# Patient Record
Sex: Male | Born: 1950 | Race: Black or African American | Hispanic: No | Marital: Married | Smoking: Former smoker
Health system: Southern US, Community
[De-identification: ages and names within clinical notes are randomized; demographics above are authoritative.]

## PROBLEM LIST (undated history)

## (undated) DIAGNOSIS — K219 Gastro-esophageal reflux disease without esophagitis: Secondary | ICD-10-CM

## (undated) DIAGNOSIS — E785 Hyperlipidemia, unspecified: Secondary | ICD-10-CM

## (undated) DIAGNOSIS — E119 Type 2 diabetes mellitus without complications: Secondary | ICD-10-CM

## (undated) DIAGNOSIS — K635 Polyp of colon: Secondary | ICD-10-CM

## (undated) DIAGNOSIS — F1411 Cocaine abuse, in remission: Secondary | ICD-10-CM

## (undated) DIAGNOSIS — M109 Gout, unspecified: Secondary | ICD-10-CM

## (undated) DIAGNOSIS — F1010 Alcohol abuse, uncomplicated: Secondary | ICD-10-CM

## (undated) HISTORY — DX: Type 2 diabetes mellitus without complications: E11.9

## (undated) HISTORY — DX: Hyperlipidemia, unspecified: E78.5

## (undated) HISTORY — DX: Gastro-esophageal reflux disease without esophagitis: K21.9

## (undated) HISTORY — DX: Alcohol abuse, uncomplicated: F10.10

## (undated) HISTORY — DX: Polyp of colon: K63.5

## (undated) HISTORY — DX: Cocaine abuse, in remission: F14.11

## (undated) HISTORY — DX: Gout, unspecified: M10.9

---

## 2005-11-24 DIAGNOSIS — K635 Polyp of colon: Secondary | ICD-10-CM

## 2005-11-24 HISTORY — DX: Polyp of colon: K63.5

## 2015-05-03 ENCOUNTER — Encounter: Payer: Self-pay | Admitting: Urgent Care

## 2015-05-03 ENCOUNTER — Other Ambulatory Visit: Payer: Self-pay | Admitting: Urgent Care

## 2015-05-03 ENCOUNTER — Encounter (INDEPENDENT_AMBULATORY_CARE_PROVIDER_SITE_OTHER): Payer: Self-pay

## 2015-05-03 ENCOUNTER — Ambulatory Visit (INDEPENDENT_AMBULATORY_CARE_PROVIDER_SITE_OTHER): Payer: Medicare Other | Admitting: Urgent Care

## 2015-05-03 VITALS — BP 114/62 | HR 46 | Temp 98.4°F | Ht 68.0 in | Wt 161.2 lb

## 2015-05-03 DIAGNOSIS — R634 Abnormal weight loss: Secondary | ICD-10-CM

## 2015-05-03 MED ORDER — NA SULFATE-K SULFATE-MG SULF 17.5-3.13-1.6 GM/177ML PO SOLN: 1.0000 | ORAL | Status: DC

## 2015-05-03 NOTE — Patient Instructions (Signed)
Colonoscopy and EGD with Dr. Allen Norris Take half of your diabetes medications the day prior to the procedure Hold diabetes medications day of procedure Bring all your medications and/or any insulin to the hospital the day of the procedure. Follow blood sugars, call us or your PCP if any problems.

## 2015-05-03 NOTE — Assessment & Plan Note (Addendum)
Matthew Dean. is a pleasant 64 y.o. male with unintentional weight loss unknown etiology.  He also has hx chronic GERD on PPI that is well controlled.  He is diabetic.  He has had labs by Dr. Rosario Jacks and believes that thyroid disease and diabetes have been ruled out as a culprit.  EGD & Colonoscopy with Dr Allen Norris.  Differentials include occult malignancy, PUD, celiac disease, or colon polyps.  I have discussed risks & benefits which include, but are not limited to, bleeding, infection, perforation & drug reaction.  The patient agrees with this plan & written consent will be obtained.    Take half of your diabetes medications the day prior to the procedure Hold diabetes medications day of procedure Bring all your medications and/or any insulin to the hospital the day of the procedure. Follow blood sugars, call us or your PCP if any problems.

## 2015-05-03 NOTE — Progress Notes (Signed)
Gastroenterology Consultation  Referring Provider:     Casilda Carls, MD Primary Care Physician:  [facility], MD Primary Gastroenterologist:  Dr. Allen Norris     Reason for Consultation:     Unintentional Weight loss        HPI:   Matthew Dean. is a 64 y.o. y/o male referred for consultation & management of unintentional weight loss by  [facility], MD.  Patient states he went to see Dr Rosario Jacks with rapid weight loss.  CXR showed calcifications i n lung & spleen per patient.  Weight loss started 1 month ago he started to lose weight.  He lost 30#.  He started ensure & gained a few pounds back.  A1c 6.5  In Feb.  Labs were drawn last week at Dr Guerry Bruin office  (we have requested).  Denies nausea, vomiting, dysphagia, odynophagia or anorexia.  He has well controlled acid reflux on prilosec 20mg  daily.  Denies constipation, diarrhea, rectal bleeding, melena.  He started lifting weights about 6 weeks for 15-20 minutes per day.    Past Medical History  Diagnosis Date  . Diabetes mellitus without complication   . Hyperlipidemia   . Gout   . GERD (gastroesophageal reflux disease)   . Hx of cocaine abuse     QUIT FEB 2001  . ETOH abuse     QUIT FEB 2001  . Colonic polyp 2007    [facility] ?MD    No past surgical history on file.  Prior to Admission medications   Medication Sig Start Date End Date Taking? Authorizing Provider  Ascorbic Acid (VITAMIN C) 1000 MG tablet Take 1,000 mg by mouth daily.   Yes Historical Provider, MD  aspirin 81 MG tablet Take 81 mg by mouth daily.   Yes Historical Provider, MD  calcium citrate-vitamin D (CITRACAL+D) 315-200 MG-UNIT per tablet Take 1 tablet by mouth daily.   Yes Historical Provider, MD  lisinopril (PRINIVIL,ZESTRIL) 5 MG tablet Take 5 mg by mouth daily.   Yes Historical Provider, MD  metFORMIN (GLUMETZA) 500 MG (MOD) 24 hr tablet Take 500 mg by mouth 2 (two) times daily.   Yes Historical Provider, MD  omeprazole (PRILOSEC) 20 MG capsule  Take 20 mg by mouth daily.   Yes Historical Provider, MD    Family History  Problem Relation Age of Onset  . Hypertension Mother   . Diabetes Mother   . Asthma Father   . Stroke Father   . Diabetes Father   . Colon cancer Neg Hx   . Cirrhosis Father     ?weekend ETOH?other etiology    History   Social History  . Marital Status: Married    Spouse Name: N/A  . Number of Children: 0  . Years of Education: N/A   Occupational History  . Not on file.   Social History Main Topics  . Smoking status: Former Smoker -- 0.50 packs/day    Types: Cigarettes    Quit date: 12/02/2013  . Smokeless tobacco: Not on file  . Alcohol Use: No  . Drug Use: No     Comment: Recovered cocaine/heroin/marijuana/acid  . Sexual Activity: Not on file   Other Topics Concern  . 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 (No Known Allergies)   Review of Systems:    All systems reviewed and negative except where noted in HPI.   Physical Exam:  BP  114/62 mmHg  Pulse 46  Temp(Src) 98.4 F (36.9 C) (Oral)  Ht 5\' 8"  (1.727 m)  Wt 161 lb 3.2 oz (73.12 kg)  BMI 24.52 kg/m2 No LMP for male patient. General:   Alert,  Well-developed, thin, pleasant and cooperative in NAD, accompanied by her wife Head:  Normocephalic and atraumatic. Eyes:  Sclera clear, no icterus.   Conjunctiva pink. Ears:  Normal auditory acuity. Nose:  No deformity, discharge, or lesions. Mouth:  No deformity or lesions,oropharynx pink & moist. Neck:  Supple; no masses or thyromegaly. Lungs:  Respirations even and unlabored.  Clear throughout to auscultation.   No wheezes, crackles, or rhonchi. No acute distress. Heart:  Regular rate and rhythm; no murmurs, clicks, rubs, or gallops. Abdomen:  Normal bowel sounds.  No bruits.  Soft, non-tender and non-distended without masses, hepatosplenomegaly or hernias noted.  No guarding or rebound tenderness.       Rectal:  Deferred.  Msk:  Symmetrical without gross deformities.  Good, equal movement & strength bilaterally. Pulses:  Normal pulses noted. Extremities:  No clubbing or edema.  No cyanosis. Neurologic:  Alert and oriented x3;  grossly normal neurologically. Skin:  Intact without significant lesions or rashes.  No jaundice. Lymph Nodes:  No significant cervical adenopathy. Psych:  Alert and cooperative. Normal mood and affect.

## 2015-05-07 ENCOUNTER — Encounter: Payer: Self-pay | Admitting: Internal Medicine

## 2015-05-07 ENCOUNTER — Ambulatory Visit (INDEPENDENT_AMBULATORY_CARE_PROVIDER_SITE_OTHER): Payer: Medicare Other | Admitting: Internal Medicine

## 2015-05-07 VITALS — BP 104/62 | HR 51 | Ht 68.0 in | Wt 164.4 lb

## 2015-05-07 DIAGNOSIS — R634 Abnormal weight loss: Secondary | ICD-10-CM

## 2015-05-07 DIAGNOSIS — J8410 Pulmonary fibrosis, unspecified: Secondary | ICD-10-CM

## 2015-05-07 NOTE — Patient Instructions (Signed)
Nothing on cxr that would explain wt loss and your bmi is still technically in the obese range which is not where you want it since you are diabeitic > a better weight for you would be around 150 but if you drop below this your doctor may need to consider additonal evaluation to include a chest ct and follow up here as needed

## 2015-05-07 NOTE — Progress Notes (Signed)
   Subjective:    Patient ID: Matthew Berns., male    DOB: 09-19-1951   MRN: [id number]  HPI   54 yobm quit smoking Jan 2015 referred to pulmonary clinic 05/07/2015 by Dr Octavia Bruckner for abn cxr/wt loss    05/07/2015 1st [facility] office visit/ Delainy Mcelhiney   Chief Complaint  Patient presents with  . Pulmonary Consult    Referred by Dr. Alicia Amel for eval of abnormal cxr.   dx dm oct 2015 on glucophage Nov 2015  Wt loss noted  02/2015  Appetite fine/ no change bm or bladder  Started on ensure > wt trending back up/ has GI f/u planned with colonoscopy  Not limited by breathing from desired activities  And quite active though no aerobics  No  assoc chronic cough or cp or chest tightness, subjective wheeze overt sinus or hb symptoms. No unusual exp hx or h/o childhood pna/ asthma or knowledge of premature birth.  Sleeping ok without nocturnal  or early am exacerbation  of respiratory  c/o's or need for noct saba. Also denies any obvious fluctuation of symptoms with weather or environmental changes or other aggravating or alleviating factors except as outlined above   Current Medications, Allergies, Complete Past Medical History, Past Surgical History, Family History, and Social History were reviewed in [employer] record.         Review of Systems  Constitutional: Positive for unexpected weight change. Negative for fever, chills, activity change and appetite change.  HENT: Negative for congestion, dental problem, postnasal drip, rhinorrhea, sneezing, sore throat, trouble swallowing and voice change.   Eyes: Negative for visual disturbance.  Respiratory: Negative for cough, choking and shortness of breath.   Cardiovascular: Negative for chest pain and leg swelling.  Gastrointestinal: Negative for nausea, vomiting and abdominal pain.  Genitourinary: Negative for difficulty urinating.       Indigestion  Musculoskeletal: Negative for arthralgias.  Skin: Negative for  rash.  Psychiatric/Behavioral: Negative for behavioral problems and confusion.       Objective:   Physical Exam  amb bm nad  Wt Readings from Last 3 Encounters:  05/07/15 164 lb 6.4 oz (74.571 kg)  05/03/15 161 lb 3.2 oz (73.12 kg)    Vital signs reviewed  HEENT: full dentures/   turbinates, and orophanx. Nl external ear canals without cough reflex   NECK :  without JVD/Nodes/TM/ nl carotid upstrokes bilaterally   LUNGS: no acc muscle use, clear to A and P bilaterally without cough on insp or exp maneuvers   CV:  RRR  no s3 or murmur or increase in P2, no edema   ABD:  soft and nontender with nl excursion in the supine position. No bruits or organomegaly, bowel sounds nl  MS:  warm without deformities, calf tenderness, cyanosis or clubbing  SKIN: warm and dry without lesions    NEURO:  alert, approp, no deficits     cxr 04/25/15  Granulomatous dz         Assessment & Plan:

## 2015-05-08 ENCOUNTER — Encounter: Payer: Self-pay | Admitting: Internal Medicine

## 2015-05-08 DIAGNOSIS — J8410 Pulmonary fibrosis, unspecified: Secondary | ICD-10-CM | POA: Insufficient documentation

## 2015-05-08 NOTE — Assessment & Plan Note (Addendum)
Body mass index is 25 kg/(m2)   This is still above ideal wt for a diabetic and may be due to using glucophage for rx but would appear to be desirable as does not drop below ideal body wt Gastroenterology w/u appropriate though and encouraged him to follow through on planned w/u.   Total time counseling today 25 m

## 2015-05-08 NOTE — Assessment & Plan Note (Signed)
These are densely calcified c/w prior but not active granulomatous dz s assoc night sweats or cough so no further pulmonary w/u needed  Pulmonary f/u can be prn

## 2015-06-01 NOTE — Discharge Instructions (Signed)

## 2015-06-04 ENCOUNTER — Encounter
Admission: RE | Disposition: A | Discharge status: Home / Self Care | Payer: Self-pay | Source: Ambulatory Visit | Attending: Gastroenterology

## 2015-06-04 ENCOUNTER — Ambulatory Visit
Admission: RE | Admit: 2015-06-04 | Discharge: 2015-06-04 | Disposition: A | Discharge status: Home / Self Care | Payer: Medicare Other | Source: Ambulatory Visit | Attending: Gastroenterology | Admitting: Gastroenterology

## 2015-06-04 ENCOUNTER — Ambulatory Visit: Payer: Medicare Other | Admitting: Anesthesiology

## 2015-06-04 ENCOUNTER — Other Ambulatory Visit: Payer: Self-pay | Admitting: Gastroenterology

## 2015-06-04 DIAGNOSIS — D125 Benign neoplasm of sigmoid colon: Secondary | ICD-10-CM | POA: Diagnosis not present

## 2015-06-04 DIAGNOSIS — Z6823 Body mass index (BMI) 23.0-23.9, adult: Secondary | ICD-10-CM | POA: Diagnosis not present

## 2015-06-04 DIAGNOSIS — R634 Abnormal weight loss: Secondary | ICD-10-CM | POA: Insufficient documentation

## 2015-06-04 DIAGNOSIS — Z7982 Long term (current) use of aspirin: Secondary | ICD-10-CM | POA: Diagnosis not present

## 2015-06-04 DIAGNOSIS — K648 Other hemorrhoids: Secondary | ICD-10-CM | POA: Diagnosis not present

## 2015-06-04 DIAGNOSIS — M109 Gout, unspecified: Secondary | ICD-10-CM | POA: Insufficient documentation

## 2015-06-04 DIAGNOSIS — K635 Polyp of colon: Secondary | ICD-10-CM | POA: Insufficient documentation

## 2015-06-04 DIAGNOSIS — K5730 Diverticulosis of large intestine without perforation or abscess without bleeding: Secondary | ICD-10-CM | POA: Diagnosis not present

## 2015-06-04 DIAGNOSIS — K219 Gastro-esophageal reflux disease without esophagitis: Secondary | ICD-10-CM | POA: Insufficient documentation

## 2015-06-04 DIAGNOSIS — Z87891 Personal history of nicotine dependence: Secondary | ICD-10-CM | POA: Insufficient documentation

## 2015-06-04 DIAGNOSIS — E119 Type 2 diabetes mellitus without complications: Secondary | ICD-10-CM | POA: Insufficient documentation

## 2015-06-04 DIAGNOSIS — E785 Hyperlipidemia, unspecified: Secondary | ICD-10-CM | POA: Insufficient documentation

## 2015-06-04 DIAGNOSIS — Z79899 Other long term (current) drug therapy: Secondary | ICD-10-CM | POA: Insufficient documentation

## 2015-06-04 DIAGNOSIS — D122 Benign neoplasm of ascending colon: Secondary | ICD-10-CM | POA: Insufficient documentation

## 2015-06-04 HISTORY — PX: COLONOSCOPY WITH PROPOFOL: SHX5780

## 2015-06-04 HISTORY — PX: POLYPECTOMY: SHX5525

## 2015-06-04 HISTORY — PX: ESOPHAGOGASTRODUODENOSCOPY (EGD) WITH PROPOFOL: SHX5813

## 2015-06-04 LAB — GLUCOSE, CAPILLARY
Glucose-Capillary: 144 mg/dL — ABNORMAL HIGH (ref 65–99)
Glucose-Capillary: 127 mg/dL — ABNORMAL HIGH (ref 65–99)

## 2015-06-04 SURGERY — COLONOSCOPY WITH PROPOFOL
Anesthesia: Monitor Anesthesia Care | Wound class: Contaminated

## 2015-06-04 MED ORDER — LIDOCAINE HCL (CARDIAC) 20 MG/ML IV SOLN
INTRAVENOUS | Status: DC | PRN
  Administered 2015-06-04: 30 mg via INTRAVENOUS

## 2015-06-04 MED ORDER — ACETAMINOPHEN 160 MG/5ML PO SOLN: 325.0000 mg | ORAL | Status: DC | PRN

## 2015-06-04 MED ORDER — EPHEDRINE SULFATE 50 MG/ML IJ SOLN
INTRAMUSCULAR | Status: DC | PRN
  Administered 2015-06-04 (×2): 5 mg via INTRAVENOUS

## 2015-06-04 MED ORDER — PROPOFOL 10 MG/ML IV BOLUS
INTRAVENOUS | Status: DC | PRN
  Administered 2015-06-04 (×8): 20 mg via INTRAVENOUS

## 2015-06-04 MED ORDER — LACTATED RINGERS IV SOLN
INTRAVENOUS | Status: DC
  Administered 2015-06-04: 08:00:00 via INTRAVENOUS

## 2015-06-04 MED ORDER — ACETAMINOPHEN 325 MG PO TABS
325.0000 mg | ORAL_TABLET | ORAL | Status: DC | PRN
Start: 2015-06-04 — End: 2015-06-04

## 2015-06-04 MED ORDER — STERILE WATER FOR IRRIGATION IR SOLN
Status: DC | PRN
  Administered 2015-06-04: 08:00:00

## 2015-06-04 MED ORDER — SODIUM CHLORIDE 0.9 % IV SOLN: INTRAVENOUS | Status: DC

## 2015-06-04 SURGICAL SUPPLY — 39 items
BALLN DILATOR 10-12 8 (BALLOONS)
BALLN DILATOR 12-15 8 (BALLOONS)
BALLN DILATOR 15-18 8 (BALLOONS)
BALLN DILATOR CRE 0-12 8 (BALLOONS)
BALLN DILATOR ESOPH 8 10 CRE (MISCELLANEOUS) IMPLANT
BALLOON DILATOR 12-15 8 (BALLOONS) IMPLANT
BALLOON DILATOR 15-18 8 (BALLOONS) IMPLANT
BALLOON DILATOR CRE 0-12 8 (BALLOONS) IMPLANT
BLOCK BITE 60FR ADLT L/F GRN (MISCELLANEOUS) ×4 IMPLANT
CANISTER SUCT 1200ML W/VALVE (MISCELLANEOUS) ×4 IMPLANT
FCP ESCP3.2XJMB 240X2.8X (MISCELLANEOUS)
FORCEPS BIOP RAD 4 LRG CAP 4 (CUTTING FORCEPS) ×4 IMPLANT
FORCEPS BIOP RJ4 240 W/NDL (MISCELLANEOUS)
FORCEPS ESCP3.2XJMB 240X2.8X (MISCELLANEOUS) IMPLANT
GOWN CVR UNV OPN BCK APRN NK (MISCELLANEOUS) ×4 IMPLANT
GOWN ISOL THUMB LOOP REG UNIV (MISCELLANEOUS) ×4
HEMOCLIP INSTINCT (CLIP) IMPLANT
INJECTOR VARIJECT VIN23 (MISCELLANEOUS) IMPLANT
KIT CO2 TUBING (TUBING) IMPLANT
KIT DEFENDO VALVE AND CONN (KITS) IMPLANT
KIT ENDO PROCEDURE OLY (KITS) ×4 IMPLANT
LIGATOR MULTIBAND 6SHOOTER MBL (MISCELLANEOUS) IMPLANT
MARKER SPOT ENDO TATTOO 5ML (MISCELLANEOUS) IMPLANT
PAD GROUND ADULT SPLIT (MISCELLANEOUS) IMPLANT
SNARE SHORT THROW 13M SML OVAL (MISCELLANEOUS) ×4 IMPLANT
SNARE SHORT THROW 30M LRG OVAL (MISCELLANEOUS) IMPLANT
SPOT EX ENDOSCOPIC TATTOO (MISCELLANEOUS)
SUCTION POLY TRAP 4CHAMBER (MISCELLANEOUS) IMPLANT
SYR INFLATION 60ML (SYRINGE) IMPLANT
TRAP SUCTION POLY (MISCELLANEOUS) ×4 IMPLANT
TUBING CONN 6MMX3.1M (TUBING)
TUBING SUCTION CONN 0.25 STRL (TUBING) IMPLANT
UNDERPAD 30X60 958B10 (PK) (MISCELLANEOUS) IMPLANT
VALVE BIOPSY ENDO (VALVE) IMPLANT
VARIJECT INJECTOR VIN23 (MISCELLANEOUS)
WATER AUXILLARY (MISCELLANEOUS) IMPLANT
WATER STERILE IRR 250ML POUR (IV SOLUTION) ×4 IMPLANT
WATER STERILE IRR 500ML POUR (IV SOLUTION) IMPLANT
WIRE CRE 18-20MM 8CM F G (MISCELLANEOUS) IMPLANT

## 2015-06-04 NOTE — Anesthesia Preprocedure Evaluation (Signed)
Anesthesia Evaluation  Patient identified by MRN, date of birth, ID band  Reviewed: Allergy & Precautions, H&P , NPO status , Patient's Chart, lab work & pertinent test results  Airway Mallampati: II  TM Distance: >3 FB Neck ROM: full    Dental no notable dental hx. (+) Edentulous Upper, Edentulous Lower   Pulmonary former smoker,    Pulmonary exam normal       Cardiovascular Rhythm:regular Rate:Normal     Neuro/Psych    GI/Hepatic GERD-  ,  Endo/Other  diabetes  Renal/GU      Musculoskeletal   Abdominal   Peds  Hematology   Anesthesia Other Findings   Reproductive/Obstetrics                             Anesthesia Physical Anesthesia Plan  ASA: II  Anesthesia Plan: MAC   Post-op Pain Management:    Induction:   Airway Management Planned:   Additional Equipment:   Intra-op Plan:   Post-operative Plan:   Informed Consent: I have reviewed the patients History and Physical, chart, labs and discussed the procedure including the risks, benefits and alternatives for the proposed anesthesia with the patient or authorized representative who has indicated his/her understanding and acceptance.     Plan Discussed with: CRNA  Anesthesia Plan Comments:         Anesthesia Quick Evaluation

## 2015-06-04 NOTE — Anesthesia Postprocedure Evaluation (Signed)
  Anesthesia Post-op Note  Patient: Matthew Dean.  Procedure(s) Performed: Procedure(s) with comments: COLONOSCOPY WITH PROPOFOL (N/A) - ORAL MED DIABETIC ESOPHAGOGASTRODUODENOSCOPY (EGD) WITH PROPOFOL (N/A) POLYPECTOMY  Anesthesia type:MAC  Patient location: PACU  Post pain: Pain level controlled  Post assessment: Post-op Vital signs reviewed, Patient's Cardiovascular Status Stable, Respiratory Function Stable, Patent Airway and No signs of Nausea or vomiting  Post vital signs: Reviewed and stable  Last Vitals:  Filed Vitals:   06/04/15 0825  BP: 110/63  Pulse: 67  Temp:   Resp: 14    Level of consciousness: awake, alert  and patient cooperative  Complications: No apparent anesthesia complications

## 2015-06-04 NOTE — Op Note (Signed)
[facility] Gastroenterology Patient Name: Matthew Dean Procedure Date: 06/04/2015 7:36 AM MRN: [id number] Account #: [id number] Date of Birth: 10/08/1951 Admit Type: Outpatient Age: 64 Room: [facility] OR ROOM 01 Gender: Male Note Status: Finalized Procedure:         Upper GI endoscopy Indications:       Weight loss Providers:         Lucilla Lame, MD Referring MD:      Casilda Carls, MD (Referring MD) Medicines:         Propofol per Anesthesia Complications:     No immediate complications. Procedure:         Pre-Anesthesia Assessment:                    - Prior to the procedure, a History and Physical was                     performed, and patient medications and allergies were                     reviewed. The patient's tolerance of previous anesthesia                     was also reviewed. The risks and benefits of the procedure                     and the sedation options and risks were discussed with the                     patient. All questions were answered, and informed consent                     was obtained. Prior Anticoagulants: The patient has taken                     no previous anticoagulant or antiplatelet agents. ASA                     Grade Assessment: II - A patient with mild systemic                     disease. After reviewing the risks and benefits, the                     patient was deemed in satisfactory condition to undergo                     the procedure.                    After obtaining informed consent, the endoscope was passed                     under direct vision. Throughout the procedure, the                     patient's blood pressure, pulse, and oxygen saturations                     were monitored continuously. The Olympus GIF H180J                     colonscope (Q#:[id number]) was introduced through the mouth,  and advanced to the second part of duodenum. The upper GI                     endoscopy was  accomplished without difficulty. The patient                     tolerated the procedure well. Findings:      The esophagus was normal.      The stomach was normal.      The examined duodenum was normal. Impression:        - Normal esophagus.                    - Normal stomach.                    - Normal examined duodenum.                    - No specimens collected. Recommendation:    - Perform a colonoscopy today. Procedure Code(s): --- Professional ---                    [phone], Esophagogastroduodenoscopy, flexible, transoral;                     diagnostic, including collection of specimen(s) by                     brushing or washing, when performed (separate procedure) Diagnosis Code(s): --- Professional ---                    R63.4, Abnormal weight loss CPT copyright 2014 American Medical Association. All rights reserved. The codes documented in this report are preliminary and upon coder review may  be revised to meet current compliance requirements. Lucilla Lame, MD 06/04/2015 7:51:45 AM This report has been signed electronically. Number of Addenda: 0 Note Initiated On: 06/04/2015 7:36 AM Total Procedure Duration: 0 hours 2 minutes 11 seconds       [facility]

## 2015-06-04 NOTE — H&P (Signed)
[facility]  [address], [address] [place], [place] [address] Phone: [phone] Fax : [phone]  Primary Care Physician:  Sherrie Mustache, MD Primary Gastroenterologist:  Dr. Servando Snare  Pre-Procedure History & Physical: HPI:  Matthew Dimmick. is a 64 y.o. male is here for an endoscopy and colonoscopy.   Past Medical History  Diagnosis Date  . Hyperlipidemia   . Gout   . Hx of cocaine abuse     QUIT FEB 2001  . ETOH abuse     QUIT FEB 2001  . Colonic polyp 2007    [facility] ?MD  . Diabetes mellitus without complication     [facility]  . GERD (gastroesophageal reflux disease)     INDIGESTION    History reviewed. No pertinent past surgical history.  Prior to Admission medications   Medication Sig Start Date End Date Taking? Authorizing Provider  Ascorbic Acid (VITAMIN C) 1000 MG tablet Take 1,000 mg by mouth daily. AM   Yes Historical Provider, MD  aspirin 81 MG tablet Take 81 mg by mouth daily. AM   Yes Historical Provider, MD  calcium citrate-vitamin D (CITRACAL+D) 315-200 MG-UNIT per tablet Take 1 tablet by mouth daily. AM   Yes Historical Provider, MD  lisinopril (PRINIVIL,ZESTRIL) 5 MG tablet Take 5 mg by mouth daily. AM   Yes Historical Provider, MD  metFORMIN (GLUMETZA) 500 MG (MOD) 24 hr tablet Take 500 mg by mouth 2 (two) times daily. AM AND PM   Yes Historical Provider, MD  omeprazole (PRILOSEC) 20 MG capsule Take 20 mg by mouth daily. AM   Yes Historical Provider, MD  Na Sulfate-K Sulfate-Mg Sulf (SUPREP BOWEL PREP) SOLN Take 1 kit by mouth as directed. Patient not taking: Reported on 05/03/2015 05/03/15   Joselyn Arrow, NP    Allergies as of 05/03/2015  . (No Known Allergies)    Family History  Problem Relation Age of Onset  . Hypertension Mother   . Diabetes Mother   . Asthma Father   . Stroke Father   . Diabetes Father   . Colon cancer Neg Hx   . Cirrhosis Father     ?weekend ETOH?other etiology    History   Social History  .  Marital Status: Married    Spouse Name: N/A  . Number of Children: 0  . Years of Education: N/A   Occupational History  . Not on file.   Social History Main Topics  . Smoking status: Former Smoker -- 1.00 packs/day for 30 years    Types: Cigarettes    Quit date: 12/02/2013  . Smokeless tobacco: Never Used  . Alcohol Use: No  . Drug Use: No     Comment: Recovered cocaine/heroin/marijuana/acid  . Sexual Activity: Not on file   Other Topics Concern  . 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 Neck:  Supple; no masses or thyromegaly. Lungs:  Clear throughout to auscultation.    Heart:  Regular rate and rhythm. Abdomen:  Soft, nontender and nondistended. Normal bowel sounds, without guarding, and without rebound.   Neurologic:  Alert and  oriented x4;  grossly normal neurologically.  Impression/Plan: Matthew Pesa. is here for an endoscopy and colonoscopy to be performed for weight loss.  Risks, benefits, limitations, and alternatives regarding  endoscopy and colonoscopy have been reviewed with the patient.  Questions have been answered.  All parties agreeable.   Ollen Bowl, MD  06/04/2015, 7:36 AM

## 2015-06-04 NOTE — Transfer of Care (Signed)
Immediate Anesthesia Transfer of Care Note  Patient: Matthew Dean.  Procedure(s) Performed: Procedure(s) with comments: COLONOSCOPY WITH PROPOFOL (N/A) - ORAL MED DIABETIC ESOPHAGOGASTRODUODENOSCOPY (EGD) WITH PROPOFOL (N/A)  Patient Location: PACU  Anesthesia Type: MAC  Level of Consciousness: awake, alert  and patient cooperative  Airway and Oxygen Therapy: Patient Spontanous Breathing and Patient connected to supplemental oxygen  Post-op Assessment: Post-op Vital signs reviewed, Patient's Cardiovascular Status Stable, Respiratory Function Stable, Patent Airway and No signs of Nausea or vomiting  Post-op Vital Signs: Reviewed and stable  Complications: No apparent anesthesia complications

## 2015-06-04 NOTE — Op Note (Signed)
[facility] Gastroenterology Patient Name: Matthew Dean Procedure Date: 06/04/2015 7:36 AM MRN: [id number] Account #: [id number] Date of Birth: [birth year] Admit Type: Outpatient Age: 64 Room: [facility] OR ROOM 01 Gender: Male Note Status: Finalized Procedure:         Colonoscopy Indications:       Weight loss Providers:         Lucilla Lame, MD Medicines:         Propofol per Anesthesia Complications:     No immediate complications. Procedure:         Pre-Anesthesia Assessment:                    - Prior to the procedure, a History and Physical was                     performed, and patient medications and allergies were                     reviewed. The patient's tolerance of previous anesthesia                     was also reviewed. The risks and benefits of the procedure                     and the sedation options and risks were discussed with the                     patient. All questions were answered, and informed consent                     was obtained. Prior Anticoagulants: The patient has taken                     no previous anticoagulant or antiplatelet agents. ASA                     Grade Assessment: II - A patient with mild systemic                     disease. After reviewing the risks and benefits, the                     patient was deemed in satisfactory condition to undergo                     the procedure.                    After obtaining informed consent, the colonoscope was                     passed under direct vision. Throughout the procedure, the                     patient's blood pressure, pulse, and oxygen saturations                     were monitored continuously. The was introduced through                     the anus and advanced to the the cecum, identified by                     appendiceal orifice and ileocecal valve. The colonoscopy  was performed without difficulty. The patient tolerated      the procedure well. The quality of the bowel preparation                     was excellent. Findings:      The perianal and digital rectal examinations were normal.      A 6 mm polyp was found in the ascending colon. The polyp was sessile.       The polyp was removed with a cold snare. Resection and retrieval were       complete.      Two sessile polyps were found in the sigmoid colon. The polyps were 2 to       3 mm in size.      Multiple small-mouthed diverticula were found in the sigmoid colon.      Non-bleeding internal hemorrhoids were found during retroflexion. The       hemorrhoids were Grade I (internal hemorrhoids that do not prolapse). Impression:        - One 6 mm polyp in the ascending colon. Resected and                     retrieved.                    - Two 2 to 3 mm polyps in the sigmoid colon.                    - Diverticulosis in the sigmoid colon.                    - Non-bleeding internal hemorrhoids. Recommendation:    - Repeat colonoscopy in 5 years for surveillance. Procedure Code(s): --- Professional ---                    [phone], Colonoscopy, flexible; with removal of tumor(s),                     polyp(s), or other lesion(s) by snare technique Diagnosis Code(s): --- Professional ---                    R63.4, Abnormal weight loss                    D12.2, Benign neoplasm of ascending colon                    D12.5, Benign neoplasm of sigmoid colon CPT copyright 2014 American Medical Association. All rights reserved. The codes documented in this report are preliminary and upon coder review may  be revised to meet current compliance requirements. Lucilla Lame, MD 06/04/2015 8:08:26 AM This report has been signed electronically. Number of Addenda: 0 Note Initiated On: 06/04/2015 7:36 AM Scope Withdrawal Time: 0 hours 7 minutes 11 seconds  Total Procedure Duration: 0 hours 10 minutes 29 seconds       [facility]

## 2015-06-05 ENCOUNTER — Encounter: Payer: Self-pay | Admitting: Gastroenterology

## 2015-06-19 ENCOUNTER — Encounter: Payer: Self-pay | Admitting: Gastroenterology

## 2018-07-08 ENCOUNTER — Encounter: Payer: Self-pay | Admitting: *Deleted

## 2018-07-08 ENCOUNTER — Other Ambulatory Visit: Payer: Self-pay

## 2018-07-08 DIAGNOSIS — Z1211 Encounter for screening for malignant neoplasm of colon: Secondary | ICD-10-CM

## 2018-07-20 ENCOUNTER — Ambulatory Visit: Payer: Medicare Other | Admitting: Anesthesiology

## 2018-07-20 ENCOUNTER — Ambulatory Visit
Admission: RE | Admit: 2018-07-20 | Discharge: 2018-07-20 | Disposition: A | Discharge status: Home / Self Care | Payer: Medicare Other | Source: Ambulatory Visit | Attending: Gastroenterology | Admitting: Gastroenterology

## 2018-07-20 ENCOUNTER — Encounter
Admission: RE | Disposition: A | Discharge status: Home / Self Care | Payer: Self-pay | Source: Ambulatory Visit | Attending: Gastroenterology

## 2018-07-20 DIAGNOSIS — Z79899 Other long term (current) drug therapy: Secondary | ICD-10-CM | POA: Insufficient documentation

## 2018-07-20 DIAGNOSIS — E119 Type 2 diabetes mellitus without complications: Secondary | ICD-10-CM | POA: Diagnosis not present

## 2018-07-20 DIAGNOSIS — D120 Benign neoplasm of cecum: Secondary | ICD-10-CM | POA: Diagnosis not present

## 2018-07-20 DIAGNOSIS — K621 Rectal polyp: Secondary | ICD-10-CM | POA: Diagnosis not present

## 2018-07-20 DIAGNOSIS — Z1211 Encounter for screening for malignant neoplasm of colon: Secondary | ICD-10-CM | POA: Insufficient documentation

## 2018-07-20 DIAGNOSIS — Z7984 Long term (current) use of oral hypoglycemic drugs: Secondary | ICD-10-CM | POA: Diagnosis not present

## 2018-07-20 DIAGNOSIS — Z8249 Family history of ischemic heart disease and other diseases of the circulatory system: Secondary | ICD-10-CM | POA: Diagnosis not present

## 2018-07-20 DIAGNOSIS — Z7982 Long term (current) use of aspirin: Secondary | ICD-10-CM | POA: Insufficient documentation

## 2018-07-20 DIAGNOSIS — Z86010 Personal history of colonic polyps: Secondary | ICD-10-CM | POA: Diagnosis present

## 2018-07-20 DIAGNOSIS — Z87891 Personal history of nicotine dependence: Secondary | ICD-10-CM | POA: Diagnosis not present

## 2018-07-20 DIAGNOSIS — D122 Benign neoplasm of ascending colon: Secondary | ICD-10-CM | POA: Diagnosis not present

## 2018-07-20 DIAGNOSIS — K219 Gastro-esophageal reflux disease without esophagitis: Secondary | ICD-10-CM | POA: Insufficient documentation

## 2018-07-20 HISTORY — PX: COLONOSCOPY WITH PROPOFOL: SHX5780

## 2018-07-20 SURGERY — COLONOSCOPY WITH PROPOFOL
Anesthesia: General

## 2018-07-20 MED ORDER — LIDOCAINE HCL (PF) 2 % IJ SOLN
INTRAMUSCULAR | Status: AC
  Filled 2018-07-20: qty 10

## 2018-07-20 MED ORDER — PROPOFOL 10 MG/ML IV BOLUS
INTRAVENOUS | Status: DC | PRN
  Administered 2018-07-20: 90 mg via INTRAVENOUS
  Administered 2018-07-20: 30 mg via INTRAVENOUS

## 2018-07-20 MED ORDER — LIDOCAINE HCL (PF) 1 % IJ SOLN
INTRAMUSCULAR | Status: AC
  Filled 2018-07-20: qty 2

## 2018-07-20 MED ORDER — SODIUM CHLORIDE 0.9 % IV SOLN
INTRAVENOUS | Status: DC | PRN
  Administered 2018-07-20: 11:00:00 via INTRAVENOUS

## 2018-07-20 MED ORDER — EPHEDRINE SULFATE 50 MG/ML IJ SOLN
INTRAMUSCULAR | Status: AC
  Filled 2018-07-20: qty 1

## 2018-07-20 MED ORDER — EPHEDRINE SULFATE-NACL 50-0.9 MG/10ML-% IV SOSY
PREFILLED_SYRINGE | INTRAVENOUS | Status: DC | PRN
  Administered 2018-07-20: 10 mg via INTRAVENOUS

## 2018-07-20 MED ORDER — MIDAZOLAM HCL 2 MG/2ML IJ SOLN
INTRAMUSCULAR | Status: DC | PRN
  Administered 2018-07-20: 2 mg via INTRAVENOUS

## 2018-07-20 MED ORDER — PROPOFOL 10 MG/ML IV BOLUS
INTRAVENOUS | Status: AC
  Filled 2018-07-20: qty 20

## 2018-07-20 MED ORDER — PROPOFOL 500 MG/50ML IV EMUL
INTRAVENOUS | Status: DC | PRN
  Administered 2018-07-20: 130 ug/kg/min via INTRAVENOUS

## 2018-07-20 MED ORDER — MIDAZOLAM HCL 2 MG/2ML IJ SOLN
INTRAMUSCULAR | Status: AC
  Filled 2018-07-20: qty 2

## 2018-07-20 NOTE — H&P (Signed)
Matthew Bellows, MD [address], [place], [place], [place], [address] [address], [place], [place], [place], [address] Phone: [phone]  Fax: [phone]  Primary Care Physician:  Casilda Carls, MD (Inactive)   Pre-Procedure History & Physical: HPI:  Matthew Tegtmeyer. is a 67 y.o. male is here for an colonoscopy.   Past Medical History:  Diagnosis Date  . Colonic polyp 2007   [facility] ?MD  . Diabetes mellitus without complication ([place])    ORAL MEDS  . ETOH abuse    QUIT FEB 2001  . GERD (gastroesophageal reflux disease)    INDIGESTION  . Gout   . Hx of cocaine abuse    QUIT FEB 2001  . Hyperlipidemia     Past Surgical History:  Procedure Laterality Date  . COLONOSCOPY WITH PROPOFOL N/A 06/04/2015   Procedure: COLONOSCOPY WITH PROPOFOL;  Surgeon: Lucilla Lame, MD;  Location: [facility];  Service: Endoscopy;  Laterality: N/A;  ORAL MED DIABETIC  . ESOPHAGOGASTRODUODENOSCOPY (EGD) WITH PROPOFOL N/A 06/04/2015   Procedure: ESOPHAGOGASTRODUODENOSCOPY (EGD) WITH PROPOFOL;  Surgeon: Lucilla Lame, MD;  Location: [facility];  Service: Endoscopy;  Laterality: N/A;  . POLYPECTOMY  06/04/2015   Procedure: POLYPECTOMY;  Surgeon: Lucilla Lame, MD;  Location: [facility];  Service: Endoscopy;;    Prior to Admission medications   Medication Sig Start Date End Date Taking? Authorizing Provider  Ascorbic Acid (VITAMIN C) 1000 MG tablet Take 1,000 mg by mouth daily. AM    [provider]  aspirin 81 MG tablet Take 81 mg by mouth daily. AM    [provider]  calcium citrate-vitamin D (CITRACAL+D) 315-200 MG-UNIT per tablet Take 1 tablet by mouth daily. AM    [provider]  lisinopril (PRINIVIL,ZESTRIL) 5 MG tablet Take 5 mg by mouth daily. AM    [provider]  metFORMIN (GLUMETZA) 500 MG (MOD) 24 hr tablet Take 500 mg by mouth 2 (two) times daily. AM AND PM    [provider]  Na Sulfate-K  Sulfate-Mg Sulf (SUPREP BOWEL PREP) SOLN Take 1 kit by mouth as directed. Patient not taking: Reported on 05/03/2015 05/03/15   Andria Meuse, NP  omeprazole (PRILOSEC) 20 MG capsule Take 20 mg by mouth daily. AM    [provider]    Allergies as of 07/08/2018  . (No Known Allergies)    Family History  Problem Relation Age of Onset  . Hypertension Mother   . Diabetes Mother   . Asthma Father   . Stroke Father   . Diabetes Father   . Cirrhosis Father        ?weekend ETOH?other etiology  . Colon cancer Neg Hx     Social History   Socioeconomic History  . Marital status: Married    Spouse name: Not on file  . Number of children: 0  . Years of education: Not on file  . Highest education level: Not on file  Occupational History  . Not on file  Social Needs  . Financial resource strain: Not on file  . Food insecurity:    Worry: Not on file    Inability: Not on file  . Transportation needs:    Medical: Not on file    Non-medical: Not on file  Tobacco Use  . Smoking status: Former Smoker    Packs/day: 1.00    Years: 30.00    Pack years: 30.00    Types: Cigarettes  Last attempt to quit: 12/02/2013    Years since quitting: 4.6  . Smokeless tobacco: Never Used  Substance and Sexual Activity  . Alcohol use: No    Alcohol/week: 0.0 standard drinks  . Drug use: No    Comment: Recovered cocaine/heroin/marijuana/acid  . Sexual activity: Not on file  Lifestyle  . Physical activity:    Days per week: Not on file    Minutes per session: Not on file  . Stress: Not on file  Relationships  . Social connections:    Talks on phone: Not on file    Gets together: Not on file    Attends religious service: Not on file    Active member of club or organization: Not on file    Attends meetings of clubs or organizations: Not on file    Relationship status: Not on file  . Intimate partner violence:    Fear of current or ex partner: Not on file    Emotionally abused: Not  on file    Physically abused: Not on file    Forced sexual activity: Not on file  Other Topics Concern  . 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 Neck:  Supple; no masses or thyromegaly. Lungs:  Clear throughout to auscultation, normal respiratory effort.    Heart:  +S1, +S2, Regular rate and rhythm, No edema. Abdomen:  Soft, nontender and nondistended. Normal bowel sounds, without guarding, and without rebound.   Neurologic:  Alert and  oriented x4;  grossly normal neurologically.  Impression/Plan: Matthew Pesa. is here for an colonoscopy to be performed for surveillance due to prior history of colon polyps   Risks, benefits, limitations, and alternatives regarding  colonoscopy have been reviewed with the patient.  Questions have been answered.  All parties agreeable.   Matthew Bellows, MD  07/20/2018, 11:10 AM

## 2018-07-20 NOTE — Anesthesia Preprocedure Evaluation (Signed)
Anesthesia Evaluation  Patient identified by MRN, date of birth, ID band Patient awake    Reviewed: Allergy & Precautions, NPO status , Patient's Chart, lab work & pertinent test results, reviewed documented beta blocker date and time   Airway Mallampati: II  TM Distance: >3 FB     Dental  (+) Upper Dentures, Lower Dentures   Pulmonary former smoker,           Cardiovascular      Neuro/Psych Anxiety    GI/Hepatic GERD  ,  Endo/Other  diabetes, Type 2  Renal/GU      Musculoskeletal   Abdominal   Peds  Hematology   Anesthesia Other Findings Hx of ETOH use.  Reproductive/Obstetrics                             Anesthesia Physical Anesthesia Plan  ASA: III  Anesthesia Plan: General   Post-op Pain Management:    Induction: Intravenous  PONV Risk Score and Plan:   Airway Management Planned:   Additional Equipment:   Intra-op Plan:   Post-operative Plan:   Informed Consent: I have reviewed the patients History and Physical, chart, labs and discussed the procedure including the risks, benefits and alternatives for the proposed anesthesia with the patient or authorized representative who has indicated his/her understanding and acceptance.     Plan Discussed with: CRNA  Anesthesia Plan Comments:         Anesthesia Quick Evaluation

## 2018-07-20 NOTE — Op Note (Signed)
[facility] Gastroenterology Patient Name: Matthew Dean Procedure Date: 07/20/2018 11:09 AM MRN: [id number] Account #: [id number] Date of Birth: Sep 13, 1951 Admit Type: Outpatient Age: 67 Room: [facility] ENDO ROOM 1 Gender: Male Note Status: Finalized Procedure:            Colonoscopy Indications:          High risk colon cancer surveillance: Personal history                        of colonic polyps Providers:            Jonathon Bellows MD, MD Referring MD:         Casilda Carls, MD (Referring MD) Medicines:            Monitored Anesthesia Care Complications:        No immediate complications. Procedure:            Pre-Anesthesia Assessment:                       - Prior to the procedure, a History and Physical was                        performed, and patient medications, allergies and                        sensitivities were reviewed. The patient's tolerance of                        previous anesthesia was reviewed.                       - The risks and benefits of the procedure and the                        sedation options and risks were discussed with the                        patient. All questions were answered and informed                        consent was obtained.                       - ASA Grade Assessment: III - A patient with severe                        systemic disease.                       After obtaining informed consent, the colonoscope was                        passed under direct vision. Throughout the procedure,                        the patient's blood pressure, pulse, and oxygen                        saturations were monitored continuously. The                        Colonoscope  was introduced through the anus and                        advanced to the the cecum, identified by the                        appendiceal orifice, IC valve and transillumination.                        The colonoscopy was performed with ease. The patient                    tolerated the procedure well. The quality of the bowel                        preparation was good. Findings:      Two sessile polyps were found in the rectum and cecum. The polyps were 4       to 5 mm in size. These polyps were removed with a cold biopsy forceps.       Resection and retrieval were complete.      A 6 mm polyp was found in the ascending colon. The polyp was sessile.       The polyp was removed with a cold snare. Resection and retrieval were       complete.      The entire examined colon appeared normal.      Multiple small-mouthed diverticula were found in the sigmoid colon. Impression:           - Two 4 to 5 mm polyps in the rectum and in the cecum,                        removed with a cold biopsy forceps. Resected and                        retrieved.                       - One 6 mm polyp in the ascending colon, removed with a                        cold snare. Resected and retrieved.                       - The entire examined colon is normal. Recommendation:       - Discharge patient to home (with escort).                       - Resume previous diet.                       - Continue present medications.                       - Await pathology results.                       - Repeat colonoscopy in 3 - 5 years for surveillance. Procedure Code(s):    --- Professional ---                       [id number], Colonoscopy, flexible; with  removal of tumor(s),                        polyp(s), or other lesion(s) by snare technique                       45380, 59, Colonoscopy, flexible; with biopsy, single                        or multiple Diagnosis Code(s):    --- Professional ---                       Z86.010, Personal history of colonic polyps                       K62.1, Rectal polyp                       D12.0, Benign neoplasm of cecum                       D12.2, Benign neoplasm of ascending colon CPT copyright 2017 American Medical Association. All rights  reserved. The codes documented in this report are preliminary and upon coder review may  be revised to meet current compliance requirements. Jonathon Bellows, MD Jonathon Bellows MD, MD 07/20/2018 11:42:14 AM This report has been signed electronically. Number of Addenda: 0 Note Initiated On: 07/20/2018 11:09 AM Scope Withdrawal Time: 0 hours 15 minutes 48 seconds  Total Procedure Duration: 0 hours 18 minutes 25 seconds       [facility]

## 2018-07-20 NOTE — Transfer of Care (Signed)
Immediate Anesthesia Transfer of Care Note  Patient: Matthew Dean.  Procedure(s) Performed: COLONOSCOPY WITH PROPOFOL (N/A )  Patient Location: Endoscopy Unit  Anesthesia Type:General  Level of Consciousness: drowsy and patient cooperative  Airway & Oxygen Therapy: Patient Spontanous Breathing and Patient connected to nasal cannula oxygen  Post-op Assessment: Report given to RN and Post -op Vital signs reviewed and stable  Post vital signs: Reviewed and stable  Last Vitals:  Vitals Value Taken Time  BP 93/54 07/20/2018 11:47 AM  Temp    Pulse 82 07/20/2018 11:47 AM  Resp 18 07/20/2018 11:47 AM  SpO2 98 % 07/20/2018 11:47 AM    Last Pain:  Vitals:   07/20/18 1104  TempSrc: Tympanic  PainSc: 0-No pain         Complications: No apparent anesthesia complications

## 2018-07-20 NOTE — Anesthesia Postprocedure Evaluation (Signed)
Anesthesia Post Note  Patient: Matthew Dean.  Procedure(s) Performed: COLONOSCOPY WITH PROPOFOL (N/A )  Patient location during evaluation: Endoscopy Anesthesia Type: General Level of consciousness: awake and alert Pain management: pain level controlled Vital Signs Assessment: post-procedure vital signs reviewed and stable Respiratory status: spontaneous breathing, nonlabored ventilation, respiratory function stable and patient connected to nasal cannula oxygen Cardiovascular status: blood pressure returned to baseline and stable Postop Assessment: no apparent nausea or vomiting Anesthetic complications: no     Last Vitals:  Vitals:   07/20/18 1156 07/20/18 1206  BP: 111/64 (!) 109/92  Pulse: 73 78  Resp: 15 20  Temp:    SpO2: 97% 99%    Last Pain:  Vitals:   07/20/18 1206  TempSrc:   PainSc: 0-No pain                 Iriel Nason S

## 2018-07-20 NOTE — Anesthesia Post-op Follow-up Note (Signed)
Anesthesia QCDR form completed.        

## 2018-07-21 ENCOUNTER — Encounter: Payer: Self-pay | Admitting: Gastroenterology

## 2018-07-21 LAB — SURGICAL PATHOLOGY

## 2018-07-22 ENCOUNTER — Encounter: Payer: Self-pay | Admitting: Gastroenterology

## 2019-09-04 ENCOUNTER — Emergency Department: Payer: Medicare Other

## 2019-09-04 ENCOUNTER — Encounter: Payer: Self-pay | Admitting: Intensive Care

## 2019-09-04 ENCOUNTER — Other Ambulatory Visit: Payer: Self-pay

## 2019-09-04 ENCOUNTER — Emergency Department
Admission: EM | Admit: 2019-09-04 | Discharge: 2019-09-04 | Disposition: A | Discharge status: Home / Self Care | Payer: Medicare Other | Attending: Emergency Medicine | Admitting: Emergency Medicine

## 2019-09-04 DIAGNOSIS — Z7982 Long term (current) use of aspirin: Secondary | ICD-10-CM | POA: Diagnosis not present

## 2019-09-04 DIAGNOSIS — U071 COVID-19: Secondary | ICD-10-CM | POA: Diagnosis not present

## 2019-09-04 DIAGNOSIS — Z87891 Personal history of nicotine dependence: Secondary | ICD-10-CM | POA: Diagnosis not present

## 2019-09-04 DIAGNOSIS — Z20822 Contact with and (suspected) exposure to covid-19: Secondary | ICD-10-CM

## 2019-09-04 DIAGNOSIS — E119 Type 2 diabetes mellitus without complications: Secondary | ICD-10-CM | POA: Insufficient documentation

## 2019-09-04 DIAGNOSIS — Z7984 Long term (current) use of oral hypoglycemic drugs: Secondary | ICD-10-CM | POA: Insufficient documentation

## 2019-09-04 DIAGNOSIS — M791 Myalgia, unspecified site: Secondary | ICD-10-CM | POA: Diagnosis present

## 2019-09-04 MED ORDER — PSEUDOEPH-BROMPHEN-DM 30-2-10 MG/5ML PO SYRP: 10.0000 mL | ORAL_SOLUTION | Freq: Four times a day (QID) | ORAL | 0 refills | Status: DC | PRN

## 2019-09-04 NOTE — ED Notes (Addendum)
Pt c/o chills and body aches and low grade temp starting today. Was exposed to covid one week ago. No cough or SHOB.

## 2019-09-04 NOTE — ED Provider Notes (Signed)
[facility] Emergency Department Provider Note  ____________________________________________  Time seen: Approximately 4:43 PM  I have reviewed the triage vital signs and the nursing notes.   HISTORY  Chief Complaint Generalized Body Aches and Chills    HPI Matthew Dean. is a 68 y.o. male who presents the emergency department complaining of body aches, nasal congestion, sore throat, cough, chills, possible fever.  Patient states that he has had symptoms for approximately 3 days.  He reports that last week he was playing dominoes with some friends and states that 1 of them was sick.  The friend reported that he was already tested for COVID-19 and was negative, however patient states that he is not sure that is truly the case.  Patient denies any headache, visual changes, neck pain or stiffness, chest pain, frank shortness of breath, abdominal pain, nausea vomiting, diarrhea or constipation.  No medications prior to arrival.         Past Medical History:  Diagnosis Date  . Colonic polyp 2007   [facility] ?MD  . Diabetes mellitus without complication ([place])    ORAL MEDS  . ETOH abuse    QUIT FEB 2001  . GERD (gastroesophageal reflux disease)    INDIGESTION  . Gout   . Hx of cocaine abuse ([place])    QUIT FEB 2001  . Hyperlipidemia     Patient Active Problem List   Diagnosis Date Noted  . Loss of weight   . Benign neoplasm of ascending colon   . Benign neoplasm of sigmoid colon   . Multiple calcified granulomas of lung 05/08/2015  . Unintentional weight loss 05/03/2015    Past Surgical History:  Procedure Laterality Date  . COLONOSCOPY WITH PROPOFOL N/A 06/04/2015   Procedure: COLONOSCOPY WITH PROPOFOL;  Surgeon: Lucilla Lame, MD;  Location: [facility];  Service: Endoscopy;  Laterality: N/A;  ORAL MED DIABETIC  . COLONOSCOPY WITH PROPOFOL N/A 07/20/2018   Procedure: COLONOSCOPY WITH PROPOFOL;  Surgeon: Jonathon Bellows, MD;  Location: [facility]  ENDOSCOPY;  Service: Gastroenterology;  Laterality: N/A;  . ESOPHAGOGASTRODUODENOSCOPY (EGD) WITH PROPOFOL N/A 06/04/2015   Procedure: ESOPHAGOGASTRODUODENOSCOPY (EGD) WITH PROPOFOL;  Surgeon: Lucilla Lame, MD;  Location: [facility];  Service: Endoscopy;  Laterality: N/A;  . POLYPECTOMY  06/04/2015   Procedure: POLYPECTOMY;  Surgeon: Lucilla Lame, MD;  Location: [facility];  Service: Endoscopy;;    Prior to Admission medications   Medication Sig Start Date End Date Taking? Authorizing Provider  Ascorbic Acid (VITAMIN C) 1000 MG tablet Take 1,000 mg by mouth daily. AM    [provider]  aspirin 81 MG tablet Take 81 mg by mouth daily. AM    [provider]  brompheniramine-pseudoephedrine-DM 30-2-10 MG/5ML syrup Take 10 mLs by mouth 4 (four) times daily as needed. 09/04/19   Cuthriell, Charline Bills, PA-C  calcium citrate-vitamin D (CITRACAL+D) 315-200 MG-UNIT per tablet Take 1 tablet by mouth daily. AM    [provider]  lisinopril (PRINIVIL,ZESTRIL) 5 MG tablet Take 5 mg by mouth daily. AM    [provider]  metFORMIN (GLUMETZA) 500 MG (MOD) 24 hr tablet Take 500 mg by mouth 2 (two) times daily. AM AND PM    [provider]  Na Sulfate-K Sulfate-Mg Sulf (SUPREP BOWEL PREP) SOLN Take 1 kit by mouth as directed. Patient not taking: Reported on 05/03/2015 05/03/15   Andria Meuse, NP  omeprazole (PRILOSEC) 20 MG capsule Take 20 mg by mouth daily. AM  [provider]    Allergies Patient has no known allergies.  Family History  Problem Relation Age of Onset  . Hypertension Mother   . Diabetes Mother   . Asthma Father   . Stroke Father   . Diabetes Father   . Cirrhosis Father        ?weekend ETOH?other etiology  . Colon cancer Neg Hx     Social History Social History   Tobacco Use  . Smoking status: Former Smoker    Packs/day: 1.00    Years: 30.00    Pack years: 30.00    Types: Cigarettes    Quit date:  12/02/2013    Years since quitting: 5.7  . Smokeless tobacco: Never Used  Substance Use Topics  . Alcohol use: Not Currently    Alcohol/week: 0.0 standard drinks  . Drug use: Not Currently    Comment: Recovered cocaine/heroin/marijuana/acid     Review of Systems  Constitutional: Subjective fever/chills.  Positive for body aches Eyes: No visual changes. No discharge ENT: Positive for nasal congestion and sore throat Cardiovascular: no chest pain. Respiratory: Positive cough. No SOB. Gastrointestinal: No abdominal pain.  No nausea, no vomiting.  No diarrhea.  No constipation. Musculoskeletal: Negative for musculoskeletal pain. Skin: Negative for rash, abrasions, lacerations, ecchymosis. Neurological: Negative for headaches, focal weakness or numbness. 10-point ROS otherwise negative.  ____________________________________________   PHYSICAL EXAM:  VITAL SIGNS: ED Triage Vitals  Enc Vitals Group     BP 09/04/19 1555 (!) 133/55     Pulse Rate 09/04/19 1555 66     Resp 09/04/19 1555 16     Temp 09/04/19 1555 97.9 F (36.6 C)     Temp Source 09/04/19 1555 Oral     SpO2 09/04/19 1555 96 %     Weight 09/04/19 1556 175 lb (79.4 kg)     Height 09/04/19 1556 '5\' 8"'$  (1.727 m)     Head Circumference --      Peak Flow --      Pain Score 09/04/19 1555 4     Pain Loc --      Pain Edu? --      Excl. in [place]? --      Constitutional: Alert and oriented. Well appearing and in no acute distress. Eyes: Conjunctivae are normal. PERRL. EOMI. Head: Atraumatic. ENT:      Ears: EACs and TMs unremarkable bilaterally.      Nose: No congestion/rhinnorhea.      Mouth/Throat: Mucous membranes are moist.  No oropharyngeal erythema or edema.  Uvula is midline. Neck: No stridor.  Neck is supple full range of motion Hematological/Lymphatic/Immunilogical: No cervical lymphadenopathy. Cardiovascular: Normal rate, regular rhythm. Normal S1 and S2.  Good peripheral circulation. Respiratory: Normal  respiratory effort without tachypnea or retractions. Lungs CTAB. Good air entry to the bases with no decreased or absent breath sounds. Musculoskeletal: Full range of motion to all extremities. No gross deformities appreciated. Neurologic:  Normal speech and language. No gross focal neurologic deficits are appreciated.  Skin:  Skin is warm, dry and intact. No rash noted. Psychiatric: Mood and affect are normal. Speech and behavior are normal. Patient exhibits appropriate insight and judgement.   ____________________________________________   LABS (all labs ordered are listed, but only abnormal results are displayed)  Labs Reviewed  NOVEL CORONAVIRUS, NAA (HOSP ORDER, SEND-OUT TO REF LAB; TAT 18-24 HRS)   ____________________________________________  EKG   ____________________________________________  RADIOLOGY I personally viewed and evaluated these images as part of my medical decision  making, as well as reviewing the written report by the radiologist.  Dg Chest 1 View  Result Date: 09/04/2019 CLINICAL DATA:  Fever, chills, and cough x 3 days. Hx of DM, cocaine abuse- quit 2001, former smoker- quit 2015. EXAM: CHEST  1 VIEW COMPARISON:  None. FINDINGS: The heart size and mediastinal contours are within normal limits. No focal pulmonary opacity. No evidence of pulmonary edema. No pneumothorax or pleural effusion. No acute finding in the visualized skeleton. IMPRESSION: No evidence of active disease in the chest. Electronically Signed   By: Audie Pinto M.D.   On: 09/04/2019 17:42    ____________________________________________    PROCEDURES  Procedure(s) performed:    Procedures    Medications - No data to display   ____________________________________________   INITIAL IMPRESSION / ASSESSMENT AND PLAN / ED COURSE  Pertinent labs & imaging results that were available during my care of the patient were reviewed by me and considered in my medical decision making  (see chart for details).  Review of the [place] CSRS was performed in accordance of the [place] prior to dispensing any controlled drugs.        The patient was evaluated for the symptoms described in the history of present illness. The patient was evaluated in the context of the global COVID-19 pandemic, which necessitated consideration that the patient might be at risk for infection with the SARS-CoV-2 virus that causes COVID-19. Institutional protocols and algorithms that pertain to the evaluation of patients at risk for COVID-19 are in a state of rapid change based on information released by regulatory bodies including the CDC and federal and state organizations. The most current policies and algorithms were followed during the patient's care in the ED.   Patient's diagnosis is consistent with suspected COVID-19 on this.  Patient presented to emergency department complaining of multiple viral respiratory complaints.  Patient has been around somebody else that is been sick, patient states that that person had reported a negative COVID-19 swab, but the patient has doubts that the other individual had a negative COVID-19 swab.  Chest x-ray reveals no consolidation concerning for pneumonia.  No evidence of bronchitis on chest x-ray.  Lung sounds are reassuring.  Vital signs are stable patient is stable for discharge at this time.  COVID swab still pending.  I discussed that this may be a respiratory virus versus COVID-19.  Quarantining instructions are given to the patient.  Tylenol and/or Motrin at home for fevers and body aches.  I will prescribe cough medication for the patient.  Follow-up primary care as needed..  Patient is given ED precautions to return to the ED for any worsening or new symptoms.     ____________________________________________  FINAL CLINICAL IMPRESSION(S) / ED DIAGNOSES  Final diagnoses:  Suspected COVID-19 virus infection      NEW MEDICATIONS STARTED DURING THIS  VISIT:  ED Discharge Orders         Ordered    brompheniramine-pseudoephedrine-DM 30-2-10 MG/5ML syrup  4 times daily PRN     09/04/19 1757              This chart was dictated using voice recognition software/Dragon. Despite best efforts to proofread, errors can occur which can change the meaning. Any change was purely unintentional.    Darletta Moll, PA-C 09/04/19 Sanjuana Mae, MD 09/04/19 [phone]

## 2019-09-04 NOTE — ED Triage Notes (Signed)
Patient c/o chills and body aches that started today. Reports temp of 99.6 at home.

## 2019-09-04 NOTE — ED Triage Notes (Signed)
First Nurse Note:  C/O fever, chills, cough x 3 days.    No SOB/ DOE noted.  Skin warm and dry.  NAD

## 2019-09-06 ENCOUNTER — Telehealth: Payer: Self-pay | Admitting: Emergency Medicine

## 2019-09-06 LAB — NOVEL CORONAVIRUS, NAA (HOSP ORDER, SEND-OUT TO REF LAB; TAT 18-24 HRS): SARS-CoV-2, NAA: DETECTED — AB

## 2019-09-06 NOTE — Telephone Encounter (Signed)
Called patient and explained positive covid 19 result and cdc recommendations for quarantine and contact quarantine.

## 2021-04-19 ENCOUNTER — Other Ambulatory Visit: Payer: Self-pay

## 2021-04-19 ENCOUNTER — Ambulatory Visit (INDEPENDENT_AMBULATORY_CARE_PROVIDER_SITE_OTHER): Payer: Medicare HMO | Admitting: Cardiology

## 2021-04-19 ENCOUNTER — Encounter: Payer: Self-pay | Admitting: Cardiology

## 2021-04-19 VITALS — BP 110/50 | HR 60 | Ht 68.0 in | Wt 164.0 lb

## 2021-04-19 DIAGNOSIS — E7800 Pure hypercholesterolemia, unspecified: Secondary | ICD-10-CM

## 2021-04-19 DIAGNOSIS — Z01812 Encounter for preprocedural laboratory examination: Secondary | ICD-10-CM

## 2021-04-19 DIAGNOSIS — R9439 Abnormal result of other cardiovascular function study: Secondary | ICD-10-CM | POA: Diagnosis not present

## 2021-04-19 MED ORDER — METOPROLOL TARTRATE 50 MG PO TABS: ORAL_TABLET | ORAL | 0 refills | Status: DC

## 2021-04-19 NOTE — Patient Instructions (Addendum)
Medication Instructions:  - Your physician recommends that you continue on your current medications as directed. Please refer to the Current Medication list given to you today.  *If you need a refill on your cardiac medications before your next appointment, please call your pharmacy*   Lab Work: - Your physician recommends that you return for a FASTING lipid profile/ BMP (at your convenience, but prior to your Cardiac CT appointment)  No food or drink for 8 hours prior to your lab draw except for water or black coffee.  [facility] Mall Entrance at [facility] 1st desk on the right to check in, past the screening table Lab hours: Monday- Friday (7:30 am- 5:30 pm)   If you have labs (blood work) drawn today and your tests are completely normal, you will receive your results only by: Marland Kitchen MyChart Message (if you have MyChart) OR . A paper copy in the mail If you have any lab test that is abnormal or we need to change your treatment, we will call you to review the results.   Testing/Procedures:  1) Cardiac CTA: - Your physician has requested that you have cardiac CT. Cardiac computed tomography (CT) is a painless test that uses an x-ray machine to take clear, detailed pictures of your heart.   Your cardiac CT will be scheduled at one of the below locations:   [facility] [address] [place], [place] [address] [phone]  [place] [address] [place], [place] [address] [phone]  If scheduled at [facility], please arrive at the [facility] main entrance (entrance A) of [facility] 30 minutes prior to test start time. Proceed to the [facility] Radiology Department (first floor) to check-in and test prep.  If scheduled at [facility], please arrive 15 mins early for check-in and test prep.  Please follow these instructions carefully (unless otherwise directed):  Hold all  erectile dysfunction medications at least 3 days (72 hrs) prior to test.  On the Night Before the Test: . Be sure to Drink plenty of water. . Do not consume any caffeinated/decaffeinated beverages or chocolate 12 hours prior to your test. . Do not take any antihistamines 12 hours prior to your test.   On the Day of the Test: . Drink plenty of water until 1 hour prior to the test. . Do not eat any food 4 hours prior to the test. . You may take your regular medications prior to the test.  . Take metoprolol (Lopressor) 50 mg PO 90-120 minutes prior to your scan          After the Test: . Drink plenty of water. . After receiving IV contrast, you may experience a mild flushed feeling. This is normal. . On occasion, you may experience a mild rash up to 24 hours after the test. This is not dangerous. If this occurs, you can take Benadryl 25 mg and increase your fluid intake. . If you experience trouble breathing, this can be serious. If it is severe call 911 IMMEDIATELY. If it is mild, please call our office. . If you take any of these medications: Glipizide/Metformin, Avandament, Glucavance, please do not take 48 hours after completing test unless otherwise instructed.   Once we have confirmed authorization from your insurance company, we will call you to set up a date and time for your test. Based on how quickly your insurance processes prior authorizations requests, please allow up to  4 weeks to be contacted for scheduling your Cardiac CT appointment. Be advised that routine Cardiac CT appointments could be scheduled as many as 8 weeks after your provider has ordered it.  For non-scheduling related questions, please contact the cardiac imaging nurse navigator should you have any questions/concerns: Marchia Bond, Cardiac Imaging Nurse Navigator Gordy Clement, Cardiac Imaging Nurse Navigator [facility] Direct Office Dial: [phone]   For scheduling needs,  including cancellations and rescheduling, please call [place], [phone].     Follow-Up: At [facility], you and your health needs are our priority.  As part of our continuing mission to provide you with exceptional heart care, we have created designated Provider Care Teams.  These Care Teams include your primary Cardiologist (physician) and Advanced Practice Providers (APPs -  Physician Assistants and Nurse Practitioners) who all work together to provide you with the care you need, when you need it.  We recommend signing up for the patient portal called "MyChart".  Sign up information is provided on this After Visit Summary.  MyChart is used to connect with patients for Virtual Visits (Telemedicine).  Patients are able to view lab/test results, encounter notes, upcoming appointments, etc.  Non-urgent messages can be sent to your provider as well.   To learn more about what you can do with MyChart, go to [email or web address].    Your next appointment:   4 week(s)/ after the Cardiac CT is completed  The format for your next appointment:   In Person  Provider:   You may see Kate Sable, MD or one of the following Advanced Practice Providers on your designated Care Team:    Murray Hodgkins, NP  Christell Faith, PA-C  Marrianne Mood, PA-C  Cadence Kathlen Mody, [place]  Laurann Montana, NP    Other Instructions  Cardiac CT Angiogram A cardiac CT angiogram is a procedure to look at the heart and the area around the heart. It may be done to help find the cause of chest pains or other symptoms of heart disease. During this procedure, a substance called contrast dye is injected into the blood vessels in the area to be checked. A large X-ray machine, called a CT scanner, then takes detailed pictures of the heart and the surrounding area. The procedure is also sometimes called a coronary CT angiogram, coronary artery scanning, or CTA. A cardiac CT angiogram allows the health care  provider to see how well blood is flowing to and from the heart. The health care provider will be able to see if there are any problems, such as:  Blockage or narrowing of the coronary arteries in the heart.  Fluid around the heart.  Signs of weakness or disease in the muscles, valves, and tissues of the heart. Tell a health care provider about:  Any allergies you have. This is especially important if you have had a previous allergic reaction to contrast dye.  All medicines you are taking, including vitamins, herbs, eye drops, creams, and over-the-counter medicines.  Any blood disorders you have.  Any surgeries you have had.  Any medical conditions you have.  Whether you are pregnant or may be pregnant.  Any anxiety disorders, chronic pain, or other conditions you have that may increase your stress or prevent you from lying still. What are the risks? Generally, this is a safe procedure. However, problems may occur, including:  Bleeding.  Infection.  Allergic reactions to medicines or dyes.  Damage to other structures or organs.  Kidney damage from the  contrast dye that is used.  Increased risk of cancer from radiation exposure. This risk is low. Talk with your health care provider about: ? The risks and benefits of testing. ? How you can receive the lowest dose of radiation. What happens before the procedure?  Wear comfortable clothing and remove any jewelry, glasses, dentures, and hearing aids.  Follow instructions from your health care provider about eating and drinking. This may include: ? For 12 hours before the procedure -- avoid caffeine. This includes tea, coffee, soda, energy drinks, and diet pills. Drink plenty of water or other fluids that do not have caffeine in them. Being well hydrated can prevent complications. ? For 4-6 hours before the procedure -- stop eating and drinking. The contrast dye can cause nausea, but this is less likely if your stomach is  empty.  Ask your health care provider about changing or stopping your regular medicines. This is especially important if you are taking diabetes medicines, blood thinners, or medicines to treat problems with erections (erectile dysfunction). What happens during the procedure?  Hair on your chest may need to be removed so that small sticky patches called electrodes can be placed on your chest. These will transmit information that helps to monitor your heart during the procedure.  An IV will be inserted into one of your veins.  You might be given a medicine to control your heart rate during the procedure. This will help to ensure that good images are obtained.  You will be asked to lie on an exam table. This table will slide in and out of the CT machine during the procedure.  Contrast dye will be injected into the IV. You might feel warm, or you may get a metallic taste in your mouth.  You will be given a medicine called nitroglycerin. This will relax or dilate the arteries in your heart.  The table that you are lying on will move into the CT machine tunnel for the scan.  The person running the machine will give you instructions while the scans are being done. You may be asked to: ? Keep your arms above your head. ? Hold your breath. ? Stay very still, even if the table is moving.  When the scanning is complete, you will be moved out of the machine.  The IV will be removed. The procedure may vary among health care providers and hospitals.   What can I expect after the procedure? After your procedure, it is common to have:  A metallic taste in your mouth from the contrast dye.  A feeling of warmth.  A headache from the nitroglycerin. Follow these instructions at home:  Take over-the-counter and prescription medicines only as told by your health care provider.  If you are told, drink enough fluid to keep your urine pale yellow. This will help to flush the contrast dye out of your  body.  Most people can return to their normal activities right after the procedure. Ask your health care provider what activities are safe for you.  It is up to you to get the results of your procedure. Ask your health care provider, or the department that is doing the procedure, when your results will be ready.  Keep all follow-up visits as told by your health care provider. This is important. Contact a health care provider if:  You have any symptoms of allergy to the contrast dye. These include: ? Shortness of breath. ? Rash or hives. ? A racing heartbeat. Summary  A cardiac CT angiogram is a procedure to look at the heart and the area around the heart. It may be done to help find the cause of chest pains or other symptoms of heart disease.  During this procedure, a large X-ray machine, called a CT scanner, takes detailed pictures of the heart and the surrounding area after a contrast dye has been injected into blood vessels in the area.  Ask your health care provider about changing or stopping your regular medicines before the procedure. This is especially important if you are taking diabetes medicines, blood thinners, or medicines to treat erectile dysfunction.  If you are told, drink enough fluid to keep your urine pale yellow. This will help to flush the contrast dye out of your body. This information is not intended to replace advice given to you by your health care provider. Make sure you discuss any questions you have with your health care provider. Document Revised: 07/06/2019 Document Reviewed: 07/06/2019 Elsevier Patient Education  2021 [employer].

## 2021-04-19 NOTE — Progress Notes (Signed)
Cardiology Office Note:    Date:  04/19/2021   ID:  Matthew Pesa., DOB 04/15/1951, MRN [id number]  PCP:  Matthew Carls, MD   [place] [facility] Cardiologist:  Kate Sable, MD     Referring MD: Matthew Carls, MD   Chief Complaint  Patient presents with  . New Patient (Initial Visit)    Self referral - "Part of the heart isn't getting enough blood" Meds reviewed verbally with patient.     History of Present Illness:    Matthew Everitt. is a 70 y.o. male with a hx of diabetes, former smoker x40 years, hyperlipidemia, who presents due to abnormal stress test.  Patient follows with primary care provider where he gets yearly stress testing over the past 3 years.  He denies chest pain or shortness of breath, last stress test obtain 09/03/2020 showed inferior wall defect, could be diaphragmatic attenuation.  He feels well, takes Lipitor for cholesterol control, not sure of dose.  Takes lisinopril for renal protection due to history of diabetes.  Past Medical History:  Diagnosis Date  . Colonic polyp 2007   [facility] ?MD  . Diabetes mellitus without complication ([place])    ORAL MEDS  . ETOH abuse    QUIT FEB 2001  . GERD (gastroesophageal reflux disease)    INDIGESTION  . Gout   . Hx of cocaine abuse ([place])    QUIT FEB 2001  . Hyperlipidemia     Past Surgical History:  Procedure Laterality Date  . COLONOSCOPY WITH PROPOFOL N/A 06/04/2015   Procedure: COLONOSCOPY WITH PROPOFOL;  Surgeon: Lucilla Lame, MD;  Location: [place];  Service: Endoscopy;  Laterality: N/A;  ORAL MED DIABETIC  . COLONOSCOPY WITH PROPOFOL N/A 07/20/2018   Procedure: COLONOSCOPY WITH PROPOFOL;  Surgeon: Jonathon Bellows, MD;  Location: [facility] ENDOSCOPY;  Service: Gastroenterology;  Laterality: N/A;  . ESOPHAGOGASTRODUODENOSCOPY (EGD) WITH PROPOFOL N/A 06/04/2015   Procedure: ESOPHAGOGASTRODUODENOSCOPY (EGD) WITH PROPOFOL;  Surgeon: Lucilla Lame, MD;  Location: [place];   Service: Endoscopy;  Laterality: N/A;  . POLYPECTOMY  06/04/2015   Procedure: POLYPECTOMY;  Surgeon: Lucilla Lame, MD;  Location: [place];  Service: Endoscopy;;    Current Medications: Current Meds  Medication Sig  . Ascorbic Acid (VITAMIN C) 1000 MG tablet Take 1,000 mg by mouth daily. AM  . aspirin 81 MG tablet Take 81 mg by mouth daily. AM  . calcium citrate-vitamin D (CITRACAL+D) 315-200 MG-UNIT per tablet Take 1 tablet by mouth daily. AM  . lisinopril (PRINIVIL,ZESTRIL) 5 MG tablet Take 5 mg by mouth daily. AM  . metFORMIN (GLUMETZA) 500 MG (MOD) 24 hr tablet Take 500 mg by mouth 2 (two) times daily. AM AND PM  . metoprolol tartrate (LOPRESSOR) 50 MG tablet Take 1 tablet (50 mg) 90-120 minutes prior to your Cardiac CT     Allergies:   Patient has no known allergies.   Social History   Socioeconomic History  . Marital status: Married    Spouse name: Not on file  . Number of children: 0  . Years of education: Not on file  . Highest education level: Not on file  Occupational History  . Not on file  Tobacco Use  . Smoking status: Former Smoker    Packs/day: 1.00    Years: 30.00    Pack years: 30.00    Types: Cigarettes    Quit date: 12/02/2013    Years since quitting: 7.3  . Smokeless tobacco: Never Used  Substance and  Sexual Activity  . Alcohol use: Not Currently    Alcohol/week: 0.0 standard drinks  . Drug use: Not Currently    Comment: Recovered cocaine/heroin/marijuana/acid  . Sexual activity: Not on file  Other Topics Concern  . 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 There is no history of Colon cancer.  ROS:   Please see the history of present illness.     All other systems reviewed and are negative.  EKGs/Labs/Other Studies Reviewed:    The following studies were reviewed today:   EKG:  EKG is  ordered today.  The ekg ordered today demonstrates normal sinus rhythm, normal ECG  Recent Labs: No results found for requested labs within last 8760 hours.  Recent Lipid Panel No results found for: CHOL, TRIG, HDL, CHOLHDL, VLDL, LDLCALC, LDLDIRECT   Risk Assessment/Calculations:      Physical Exam:    VS:  BP (!) 110/50 (BP Location: Left Arm, Patient Position: Sitting, Cuff Size: Normal)   Pulse 60   Ht 5\' 8"  (1.727 m)   Wt 164 lb (74.4 kg)   SpO2 98%   BMI 24.94 kg/m     Wt Readings from Last 3 Encounters:  04/19/21 164 lb (74.4 kg)  09/04/19 175 lb (79.4 kg)  07/20/18 170 lb (77.1 kg)     GEN:  Well nourished, well developed in no acute distress HEENT: Normal NECK: No JVD; No carotid bruits LYMPHATICS: No lymphadenopathy CARDIAC: RRR, no murmurs, rubs, gallops RESPIRATORY:  Clear to auscultation without rales, wheezing or rhonchi  ABDOMEN: Soft, non-tender, non-distended MUSCULOSKELETAL:  No edema; No deformity  SKIN: Warm and dry NEUROLOGIC:  Alert and oriented x 3 PSYCHIATRIC:  Normal affect   ASSESSMENT:    1. Abnormal stress test   2. Pure hypercholesterolemia   3. Pre-procedure lab exam    PLAN:    In order of problems listed above:  1. Patient with abnormal myocardial perfusion stress test, possible inferior ischemia cannot rule out attenuation.  Currently denies chest pain.  Risk factors include former smoker, diabetes.  Plan for coronary CTA to evaluate presence of CAD.  EF 72% on Myoview. 2. Hyperlipidemia, obtain fasting lipid profile.  Continue Lipitor as prescribed.  Patient will call office with dose of Lipitor.  Follow-up after  coronary CTA.     Medication Adjustments/Labs and Tests Ordered: Current medicines are reviewed at length with the patient today.  Concerns regarding medicines are outlined above.  Orders Placed This Encounter  Procedures  . CT CORONARY MORPH W/CTA COR W/SCORE W/CA W/CM &/OR WO/CM  . CT CORONARY FRACTIONAL FLOW RESERVE DATA PREP  . CT CORONARY FRACTIONAL FLOW RESERVE FLUID ANALYSIS  . Lipid Profile  . Basic metabolic panel  . EKG 12-Lead   Meds ordered this encounter  Medications  . metoprolol tartrate (LOPRESSOR) 50 MG tablet    Sig: Take 1 tablet (50 mg) 90-120 minutes prior to your Cardiac CT    Dispense:  1 tablet    Refill:  0  Patient Instructions   Medication Instructions:  - Your physician recommends that you continue on your current medications as directed. Please refer to the Current Medication list given to you today.  *If you need a refill on your cardiac medications before your next appointment, please call your pharmacy*   Lab Work: - Your physician recommends that you return for a FASTING lipid profile/ BMP (at your convenience, but prior to your Cardiac CT appointment)  No food or drink for 8 hours prior to your lab draw except for water or black coffee.  Medical Mall Entrance at [facility] 1st desk on the right to check in, past the screening table Lab hours: Monday- Friday (7:30 am- 5:30 pm)   If you have labs (blood work) drawn today and your tests are completely normal, you will receive your results only by: Marland Kitchen MyChart Message (if you have MyChart) OR . A paper copy in the mail If you have any lab test that is abnormal or we need to change your treatment, we will call you to review the results.   Testing/Procedures:  1) Cardiac CTA: - Your physician has requested that you have cardiac CT. Cardiac computed tomography (CT) is a painless test that uses an x-ray machine to take clear, detailed pictures of your heart.   Your cardiac CT will be scheduled  at one of the below locations:   [facility] [address] [place], [place] [address] [phone]  [place] [address] [place], [place] [address] [phone]  If scheduled at [facility], please arrive at the [facility] main entrance (entrance A) of [facility] 30 minutes prior to test start time. Proceed to the [facility] Radiology Department (first floor) to check-in and test prep.  If scheduled at [facility], please arrive 15 mins early for check-in and test prep.  Please follow these instructions carefully (unless otherwise directed):  Hold all erectile dysfunction medications at least 3 days (72 hrs) prior to test.  On the Night Before the Test: . Be sure to Drink plenty of water. . Do not consume any caffeinated/decaffeinated beverages or chocolate 12 hours prior to your test. . Do not take any antihistamines 12 hours prior to your test.   On the Day of the Test: . Drink plenty of water until 1 hour prior to the test. . Do not eat any food 4 hours prior to the test. . You may take your regular medications prior to the test.  . Take metoprolol (Lopressor) 50 mg PO 90-120 minutes prior to your scan          After the Test: . Drink plenty of water. . After receiving IV contrast, you may experience a mild flushed feeling. This is normal. . On occasion, you may experience a mild rash up to 24 hours after the test. This is not dangerous. If this occurs, you can take Benadryl 25 mg and increase your fluid intake. . If you experience trouble breathing, this can be serious. If it is severe call 911 IMMEDIATELY. If it is mild, please call our office. . If you take any of these medications: Glipizide/Metformin, Avandament, Glucavance, please do not take 48 hours after completing test unless otherwise instructed.   Once we have confirmed authorization from your  insurance company, we will call you to set up a date and time for your test. Based on how quickly your insurance processes prior authorizations  requests, please allow up to 4 weeks to be contacted for scheduling your Cardiac CT appointment. Be advised that routine Cardiac CT appointments could be scheduled as many as 8 weeks after your provider has ordered it.  For non-scheduling related questions, please contact the cardiac imaging nurse navigator should you have any questions/concerns: Marchia Bond, Cardiac Imaging Nurse Navigator Gordy Clement, Cardiac Imaging Nurse Navigator [facility] Direct Office Dial: [phone]   For scheduling needs, including cancellations and rescheduling, please call [place], [phone].     Follow-Up: At [facility], you and your health needs are our priority.  As part of our continuing mission to provide you with exceptional heart care, we have created designated Provider Care Teams.  These Care Teams include your primary Cardiologist (physician) and Advanced Practice Providers (APPs -  Physician Assistants and Nurse Practitioners) who all work together to provide you with the care you need, when you need it.  We recommend signing up for the patient portal called "MyChart".  Sign up information is provided on this After Visit Summary.  MyChart is used to connect with patients for Virtual Visits (Telemedicine).  Patients are able to view lab/test results, encounter notes, upcoming appointments, etc.  Non-urgent messages can be sent to your provider as well.   To learn more about what you can do with MyChart, go to [email or web address].    Your next appointment:   4 week(s)/ after the Cardiac CT is completed  The format for your next appointment:   In Person  Provider:   You may see Kate Sable, MD or one of the following Advanced Practice Providers on your designated Care Team:    Murray Hodgkins, NP  Christell Faith, PA-C  Marrianne Mood, PA-C  Cadence Kathlen Mody, [place]  Laurann Montana, NP    Other Instructions  Cardiac CT Angiogram A cardiac CT angiogram is a procedure to look at the heart and the area around the heart. It may be done to help find the cause of chest pains or other symptoms of heart disease. During this procedure, a substance called contrast dye is injected into the blood vessels in the area to be checked. A large X-ray machine, called a CT scanner, then takes detailed pictures of the heart and the surrounding area. The procedure is also sometimes called a coronary CT angiogram, coronary artery scanning, or CTA. A cardiac CT angiogram allows the health care provider to see how well blood is flowing to and from the heart. The health care provider will be able to see if there are any problems, such as:  Blockage or narrowing of the coronary arteries in the heart.  Fluid around the heart.  Signs of weakness or disease in the muscles, valves, and tissues of the heart. Tell a health care provider about:  Any allergies you have. This is especially important if you have had a previous allergic reaction to contrast dye.  All medicines you are taking, including vitamins, herbs, eye drops, creams, and over-the-counter medicines.  Any blood disorders you have.  Any surgeries you have had.  Any medical conditions you have.  Whether you are pregnant or may be pregnant.  Any anxiety disorders, chronic pain, or other conditions you have that may increase your stress or prevent you from lying still. What are the risks? Generally, this is a safe procedure. However, problems may occur, including:  Bleeding.  Infection.  Allergic reactions to medicines or dyes.  Damage to other structures or organs.  Kidney damage from the contrast dye that is used.  Increased risk of cancer from radiation exposure. This risk is low. Talk with your health care provider about: ? The risks and  benefits of testing. ? How you can receive the lowest dose of radiation. What happens before the procedure?  Wear comfortable clothing and remove any jewelry, glasses, dentures, and hearing aids.  Follow instructions from your health care provider about eating and drinking. This may include: ? For 12 hours before the procedure -- avoid caffeine. This includes tea, coffee, soda, energy drinks, and diet pills. Drink plenty of water or other fluids that do not have caffeine in them. Being well hydrated can prevent complications. ? For 4-6 hours before the procedure -- stop eating and drinking. The contrast dye can cause nausea, but this is less likely if your stomach is empty.  Ask your health care provider about changing or stopping your regular medicines. This is especially important if you are taking diabetes medicines, blood thinners, or medicines to treat problems with erections (erectile dysfunction). What happens during the procedure?  Hair on your chest may need to be removed so that small sticky patches called electrodes can be placed on your chest. These will transmit information that helps to monitor your heart during the procedure.  An IV will be inserted into one of your veins.  You might be given a medicine to control your heart rate during the procedure. This will help to ensure that good images are obtained.  You will be asked to lie on an exam table. This table will slide in and out of the CT machine during the procedure.  Contrast dye will be injected into the IV. You might feel warm, or you may get a metallic taste in your mouth.  You will be given a medicine called nitroglycerin. This will relax or dilate the arteries in your heart.  The table that you are lying on will move into the CT machine tunnel for the scan.  The person running the machine will give you instructions while the scans are being done. You may be asked to: ? Keep your arms above your head. ? Hold your  breath. ? Stay very still, even if the table is moving.  When the scanning is complete, you will be moved out of the machine.  The IV will be removed. The procedure may vary among health care providers and hospitals.   What can I expect after the procedure? After your procedure, it is common to have:  A metallic taste in your mouth from the contrast dye.  A feeling of warmth.  A headache from the nitroglycerin. Follow these instructions at home:  Take over-the-counter and prescription medicines only as told by your health care provider.  If you are told, drink enough fluid to keep your urine pale yellow. This will help to flush the contrast dye out of your body.  Most people can return to their normal activities right after the procedure. Ask your health care provider what activities are safe for you.  It is up to you to get the results of your procedure. Ask your health care provider, or the department that is doing the procedure, when your results will be ready.  Keep all follow-up visits as told by your health care provider. This is important. Contact a health care provider if:  You have any symptoms of allergy to the contrast dye. These include: ? Shortness of breath. ? Rash or hives. ? A  racing heartbeat. Summary  A cardiac CT angiogram is a procedure to look at the heart and the area around the heart. It may be done to help find the cause of chest pains or other symptoms of heart disease.  During this procedure, a large X-ray machine, called a CT scanner, takes detailed pictures of the heart and the surrounding area after a contrast dye has been injected into blood vessels in the area.  Ask your health care provider about changing or stopping your regular medicines before the procedure. This is especially important if you are taking diabetes medicines, blood thinners, or medicines to treat erectile dysfunction.  If you are told, drink enough fluid to keep your urine pale  yellow. This will help to flush the contrast dye out of your body. This information is not intended to replace advice given to you by your health care provider. Make sure you discuss any questions you have with your health care provider. Document Revised: 07/06/2019 Document Reviewed: 07/06/2019 Elsevier Patient Education  2021 [place].      Signed, Kate Sable, MD  04/19/2021 11:34 AM    [place]

## 2021-05-07 ENCOUNTER — Telehealth (HOSPITAL_COMMUNITY): Payer: Self-pay | Admitting: Emergency Medicine

## 2021-05-07 ENCOUNTER — Other Ambulatory Visit
Admission: RE | Admit: 2021-05-07 | Discharge: 2021-05-07 | Disposition: A | Discharge status: Home / Self Care | Payer: Medicare HMO | Source: Ambulatory Visit | Attending: Cardiology | Admitting: Cardiology

## 2021-05-07 DIAGNOSIS — E7800 Pure hypercholesterolemia, unspecified: Secondary | ICD-10-CM | POA: Insufficient documentation

## 2021-05-07 DIAGNOSIS — Z01812 Encounter for preprocedural laboratory examination: Secondary | ICD-10-CM | POA: Diagnosis present

## 2021-05-07 LAB — LIPID PANEL
Cholesterol: 139 mg/dL (ref 0–200)
HDL: 66 mg/dL (ref >40)
LDL Cholesterol: 52 mg/dL (ref 0–99)
Total CHOL/HDL Ratio: 2.1 RATIO
Triglycerides: 103 mg/dL (ref <150)
VLDL: 21 mg/dL (ref 0–40)

## 2021-05-07 LAB — BASIC METABOLIC PANEL
Anion gap: 7 (ref 5–15)
BUN: 13 mg/dL (ref 8–23)
CO2: 24 mmol/L (ref 22–32)
Calcium: 9.4 mg/dL (ref 8.9–10.3)
Chloride: 107 mmol/L (ref 98–111)
Creatinine, Ser: 0.9 mg/dL (ref 0.61–1.24)
GFR, Estimated: >60 mL/min (ref >60)
Glucose, Bld: 206 mg/dL — ABNORMAL HIGH (ref 70–99)
Potassium: 4.2 mmol/L (ref 3.5–5.1)
Sodium: 138 mmol/L (ref 135–145)

## 2021-05-07 NOTE — Telephone Encounter (Signed)
Reaching out to patient to offer assistance regarding upcoming cardiac imaging study; pt verbalizes understanding of appt date/time, parking situation and where to check in, pre-test NPO status and medications ordered, and verified current allergies; name and call back number provided for further questions should they arise Marchia Bond RN Navigator Cardiac Imaging [facility] [phone] office [phone] cell  50mg  metoprolol tart 2 hr prior to scan

## 2021-05-09 ENCOUNTER — Other Ambulatory Visit: Payer: Self-pay

## 2021-05-09 ENCOUNTER — Ambulatory Visit
Admission: RE | Admit: 2021-05-09 | Discharge: 2021-05-09 | Disposition: A | Discharge status: Home / Self Care | Payer: Medicare HMO | Source: Ambulatory Visit | Attending: Cardiology | Admitting: Cardiology

## 2021-05-09 DIAGNOSIS — I700 Atherosclerosis of aorta: Secondary | ICD-10-CM | POA: Diagnosis not present

## 2021-05-09 DIAGNOSIS — R9439 Abnormal result of other cardiovascular function study: Secondary | ICD-10-CM | POA: Diagnosis not present

## 2021-05-09 DIAGNOSIS — R9430 Abnormal result of cardiovascular function study, unspecified: Secondary | ICD-10-CM

## 2021-05-09 MED ORDER — ATORVASTATIN CALCIUM 10 MG PO TABS: 10.0000 mg | ORAL_TABLET | Freq: Every day | ORAL | 3 refills | Status: DC

## 2021-05-09 MED ORDER — NITROGLYCERIN 0.4 MG SL SUBL
0.8000 mg | SUBLINGUAL_TABLET | Freq: Once | SUBLINGUAL | Status: AC
  Administered 2021-05-09: 0.8 mg via SUBLINGUAL

## 2021-05-09 MED ORDER — IOHEXOL 350 MG/ML SOLN
75.0000 mL | Freq: Once | INTRAVENOUS | Status: AC | PRN
  Administered 2021-05-09: 75 mL via INTRAVENOUS

## 2021-05-09 NOTE — Progress Notes (Signed)
Patient tolerated CT well. Gave patient two bottle of water to drink. Vital signs stable encourage to drink water throughout day.Reasons explained and verbalized understanding. Ambulated steady gait.

## 2021-05-09 NOTE — Progress Notes (Signed)
Cardiology Office Note    Date:  05/16/2021   ID:  Matthew Pesa., DOB 11-02-1951, MRN [id number]  PCP:  Casilda Carls, MD  Cardiologist:  Kate Sable, MD  Electrophysiologist:  None   Chief Complaint: Follow up  History of Present Illness:   Matthew Crosson. is a 70 y.o. male with history of nonobstructive CAD, DM2, HLD, and prior tobacco use for 40 years quitting approximately 5 to 6 years ago who presents for follow-up of coronary CTA.  He was evaluated by Dr. Garen Lah, as a new patient, self-referral for abnormal stress test with his PCP.  He reported annual stress testing over the preceding 3 years with most recent stress test on 06/03/2020 demonstrating an inferior wall defect, possibly diaphragmatic attenuation.  He denied chest pain or shortness of breath.  EKG showed sinus rhythm with no acute ST-T changes.  He was scheduled for a coronary CTA, which was completed on 05/09/2021 and showed a calcium score of 2.39, which was 32nd percentile, minimal LCx calcification with no significant obstructive CAD, and aortic atherosclerosis.  He comes in continuing to do well from a cardiac perspective.  No chest pain, dyspnea, palpitations, dizziness, presyncope, syncope, lower extremity swelling, or orthopnea.  Blood pressure remains well controlled.  He is tolerating aspirin, atorvastatin, and lisinopril without issues.  He does indicate he is working with his PCP for improved control on his blood sugars.   Labs independently reviewed: 04/2021 - potassium 4.2, BUN 13, serum creatinine 0.9, TC 139, TG 103, HDL 66, LDL 52  Past Medical History:  Diagnosis Date   Colonic polyp 2007   [facility] ?MD   Diabetes mellitus without complication ([place])    ORAL MEDS   ETOH abuse    QUIT FEB 2001   GERD (gastroesophageal reflux disease)    INDIGESTION   Gout    Hx of cocaine abuse ([place])    QUIT FEB 2001   Hyperlipidemia     Past Surgical History:  Procedure Laterality  Date   COLONOSCOPY WITH PROPOFOL N/A 06/04/2015   Procedure: COLONOSCOPY WITH PROPOFOL;  Surgeon: Lucilla Lame, MD;  Location: [place];  Service: Endoscopy;  Laterality: N/A;  ORAL MED DIABETIC   COLONOSCOPY WITH PROPOFOL N/A 07/20/2018   Procedure: COLONOSCOPY WITH PROPOFOL;  Surgeon: Jonathon Bellows, MD;  Location: [facility];  Service: Gastroenterology;  Laterality: N/A;   ESOPHAGOGASTRODUODENOSCOPY (EGD) WITH PROPOFOL N/A 06/04/2015   Procedure: ESOPHAGOGASTRODUODENOSCOPY (EGD) WITH PROPOFOL;  Surgeon: Lucilla Lame, MD;  Location: [place];  Service: Endoscopy;  Laterality: N/A;   POLYPECTOMY  06/04/2015   Procedure: POLYPECTOMY;  Surgeon: Lucilla Lame, MD;  Location: [employer];  Service: Endoscopy;;    Current Medications: Current Meds  Medication Sig   Ascorbic Acid (VITAMIN C) 1000 MG tablet Take 1,000 mg by mouth daily. AM   aspirin 81 MG tablet Take 81 mg by mouth daily. AM   atorvastatin (LIPITOR) 10 MG tablet Take 1 tablet (10 mg total) by mouth daily.   calcium citrate-vitamin D (CITRACAL+D) 315-200 MG-UNIT per tablet Take 1 tablet by mouth daily. AM   lisinopril (PRINIVIL,ZESTRIL) 5 MG tablet Take 5 mg by mouth daily. AM   metFORMIN (GLUMETZA) 500 MG (MOD) 24 hr tablet Take 500 mg by mouth 2 (two) times daily. AM AND PM    Allergies:   Patient has no known allergies.   Social History   Socioeconomic History   Marital status: Married    Spouse name: Not on  file   Number of children: 0   Years of education: Not on file   Highest education level: Not on file  Occupational History   Not on file  Tobacco Use   Smoking status: Former    Packs/day: 1.00    Years: 30.00    Pack years: 30.00    Types: Cigarettes    Quit date: 12/02/2013    Years since quitting: 7.4   Smokeless tobacco: Never  Substance and Sexual Activity   Alcohol use: Not Currently    Alcohol/week: 0.0 standard drinks   Drug use: Not Currently    Comment: Recovered  cocaine/heroin/marijuana/acid   Sexual activity: Not on file  Other Topics Concern   Not on file  Social History Narrative   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 There is no history of Colon cancer.  ROS:   Review of Systems  Constitutional:  Negative for chills, diaphoresis, fever, malaise/fatigue and weight loss.  HENT:  Negative for congestion.   Eyes:  Negative for discharge and redness.  Respiratory:  Negative for cough, sputum production, shortness of breath and wheezing.   Cardiovascular:  Negative for chest pain, palpitations, orthopnea, claudication, leg swelling and PND.  Gastrointestinal:  Negative for abdominal pain, heartburn, nausea and vomiting.  Musculoskeletal:  Negative for falls and myalgias.  Skin:  Negative for rash.  Neurological:  Negative for dizziness, tingling, tremors, sensory change, speech change, focal weakness, loss of consciousness and weakness.  Endo/Heme/Allergies:  Does not bruise/bleed easily.  Psychiatric/Behavioral:  Negative for substance abuse. The patient is not nervous/anxious.   All other systems reviewed and are negative.   EKGs/Labs/Other Studies Reviewed:    Studies reviewed were summarized above. The additional studies were reviewed today:  Coronary CTA 05/09/2021: Aorta:  Normal size.  No calcifications.  No dissection.   Aortic Valve:  Trileaflet.  No calcifications.   Coronary Arteries:  Normal coronary origin.  Right dominance.   RCA is a dominant artery that gives rise to PDA and PLA. There is  no plaque.   Left main is a large artery that gives rise to LAD and LCX arteries.   LAD has no plaque.   LCX is a non-dominant artery that gives rise to two obtuse marginal branches. There is minimal calcification in the proximal LCx with no obstruction (<25%).   Other findings:   Normal pulmonary vein drainage into the left atrium.   Normal left atrial appendage without a thrombus.   Normal size of the pulmonary artery.   IMPRESSION: 1. Coronary calcium score of 2.39. This was 32nd percentile for age and sex matched control.   2. Normal coronary origin with right dominance.   3. Minimal proximal LCx calcification. No significant obstruction (<25%)   4. CAD-RADS 1. Minimal non-obstructive CAD (0-24%). Consider non-atherosclerotic causes of chest pain. Consider preventive therapy and risk factor modification.   EKG:  EKG is not ordered today.  Recent Labs: 05/07/2021: BUN 13; Creatinine, Ser 0.90; Potassium 4.2; Sodium 138  Recent Lipid Panel    Component Value  Date/Time   CHOL 139 05/07/2021 1441   TRIG 103 05/07/2021 1441   HDL 66 05/07/2021 1441   CHOLHDL 2.1 05/07/2021 1441   VLDL 21 05/07/2021 1441   LDLCALC 52 05/07/2021 1441    PHYSICAL EXAM:    VS:  BP 128/62 (BP Location: Left Arm, Patient Position: Sitting, Cuff Size: Normal)   Pulse 60   Ht 5\' 8"  (1.727 m)   Wt 162 lb 2 oz (73.5 kg)   SpO2 97%   BMI 24.65 kg/m   BMI: Body mass index is 24.65 kg/m.  Physical Exam Vitals reviewed.  Constitutional:      Appearance: He is well-developed.  HENT:     Head: Normocephalic and atraumatic.  Eyes:     General:        Right eye: No discharge.        Left eye: No discharge.  Neck:     Vascular: No JVD.  Cardiovascular:     Rate and Rhythm: Normal rate and regular rhythm.     Pulses:          Dorsalis pedis pulses are 2+ on the right side and 2+ on the left side.       Posterior tibial pulses are 2+ on the right side and 2+ on the left side.      Heart sounds: Normal heart sounds, S1 normal and S2 normal. Heart sounds not distant. No midsystolic click and no opening snap. No murmur heard.   No friction rub.  Pulmonary:     Effort: Pulmonary effort is normal. No respiratory distress.     Breath sounds: Normal breath sounds. No decreased breath sounds, wheezing or rales.  Chest:     Chest wall: No tenderness.  Abdominal:     General: There is no distension.     Palpations: Abdomen is soft.     Tenderness: There is no abdominal tenderness.  Musculoskeletal:     Cervical back: Normal range of motion.  Skin:    General: Skin is warm and dry.     Nails: There is no clubbing.  Neurological:     Mental Status: He is alert and oriented to person, place, and time.  Psychiatric:        Speech: Speech normal.        Behavior: Behavior normal.        Thought Content: Thought content normal.        Judgment: Judgment normal.    Wt Readings from Last 3 Encounters:  05/16/21 162 lb 2 oz (73.5 kg)  04/19/21 164 lb (74.4 kg)  09/04/19 175 lb (79.4 kg)     ASSESSMENT & PLAN:   Nonobstructive CAD: He is doing well without any symptoms concerning for angina.  Recent coronary CTA showed minimal less than 25% LCx stenosis as outlined above.  Continue primary prevention and risk factor modification including aspirin and atorvastatin.  No indication for further testing at this time.  HLD: LDL 66.  He remains on atorvastatin.  DM2: He indicates his PCP is following his A1c and plans to adjust medications as needed.  Disposition: F/u with Dr. Garen Lah or an APP in 12 months.   Medication Adjustments/Labs and Tests Ordered: Current medicines are reviewed at length with the patient today.  Concerns regarding medicines are outlined above. Medication changes, Labs and Tests ordered today are summarized above and listed in the Patient Instructions accessible in Encounters.   Signed, Christell Faith, PA-C 05/16/2021 2:12 PM  [address] [place], [place] [address] [phone]

## 2021-05-14 ENCOUNTER — Telehealth: Payer: Self-pay

## 2021-05-14 NOTE — Telephone Encounter (Signed)
The patient has been notified of the result and verbalized understanding.  All questions (if any) were answered. Kavin Leech, RN 05/14/2021 12:58 PM

## 2021-05-14 NOTE — Telephone Encounter (Signed)
Called patient and left a VM requesting a call back so I could give him the following result note from Dr. Garen Lah regarding his CCTA.  Coronary CTA with minimal nonobstructive disease in the left circumflex artery. Overall okay test, no significant abnormalities/obstruction noted.

## 2021-05-16 ENCOUNTER — Ambulatory Visit (INDEPENDENT_AMBULATORY_CARE_PROVIDER_SITE_OTHER): Payer: Medicare HMO | Admitting: Physician Assistant

## 2021-05-16 ENCOUNTER — Encounter: Payer: Self-pay | Admitting: Physician Assistant

## 2021-05-16 ENCOUNTER — Other Ambulatory Visit: Payer: Self-pay

## 2021-05-16 VITALS — BP 128/62 | HR 60 | Ht 68.0 in | Wt 162.1 lb

## 2021-05-16 DIAGNOSIS — E119 Type 2 diabetes mellitus without complications: Secondary | ICD-10-CM | POA: Diagnosis not present

## 2021-05-16 DIAGNOSIS — E785 Hyperlipidemia, unspecified: Secondary | ICD-10-CM

## 2021-05-16 DIAGNOSIS — I2510 Atherosclerotic heart disease of native coronary artery without angina pectoris: Secondary | ICD-10-CM | POA: Diagnosis not present

## 2021-05-16 NOTE — Patient Instructions (Signed)
Medication Instructions:  No changes at this time.  *If you need a refill on your cardiac medications before your next appointment, please call your pharmacy*   Lab Work: None  If you have labs (blood work) drawn today and your tests are completely normal, you will receive your results only by: [place] (if you have MyChart) OR A paper copy in the mail If you have any lab test that is abnormal or we need to change your treatment, we will call you to review the results.   Testing/Procedures: None    Follow-Up: At [facility], you and your health needs are our priority.  As part of our continuing mission to provide you with exceptional heart care, we have created designated Provider Care Teams.  These Care Teams include your primary Cardiologist (physician) and Advanced Practice Providers (APPs -  Physician Assistants and Nurse Practitioners) who all work together to provide you with the care you need, when you need it.   Your next appointment:   1 year(s)  The format for your next appointment:   In Person  Provider:   Kate Sable, MD or Christell Faith, PA-C

## 2021-12-09 IMAGING — CT CT HEART MORP W/ CTA COR W/ SCORE W/ CA W/CM &/OR W/O CM
1 of 14 series · 3 of 20 positions shown, 4 images · non-contrast
Comparison: None.

Addendum:
CLINICAL DATA: Abnormal stress test

EXAM:
Cardiac/Coronary  CTA
TECHNIQUE: The patient was scanned on a Siemens Somatom go.Top scanner.

[Series 25: multiphase % cta coronary 0.60 · axial · 0.34mm/px · z∈[-1100,-1041]mm · 3 of 3245 slices shown, 4 images]
[im 812/3245  vessel]
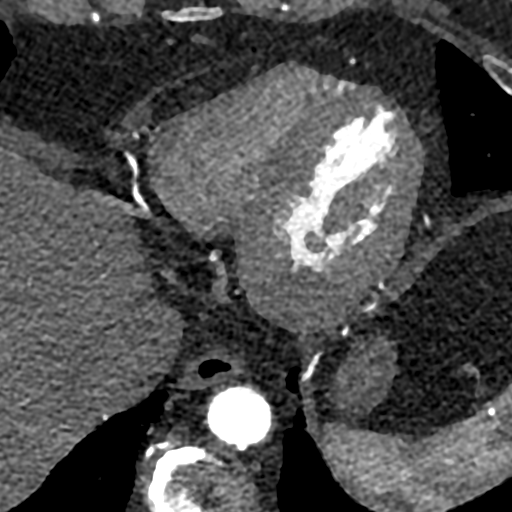
[im 812/3245  lung]
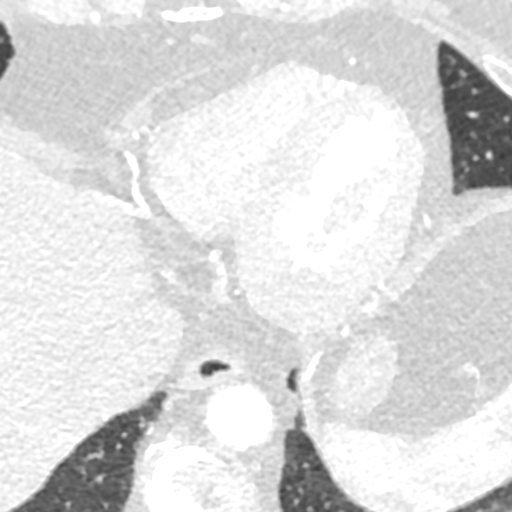
[im 1623/3245  vessel]
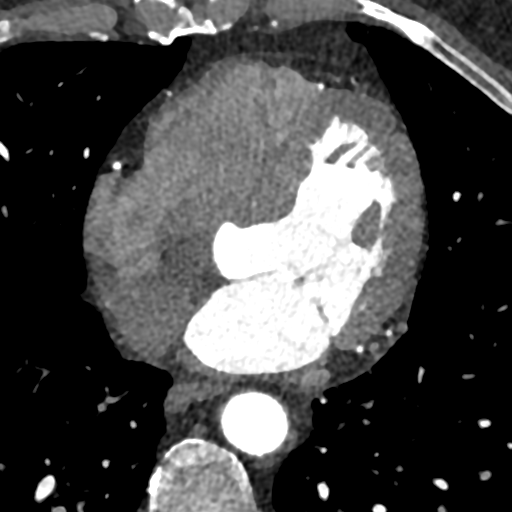
[im 2434/3245  vessel]
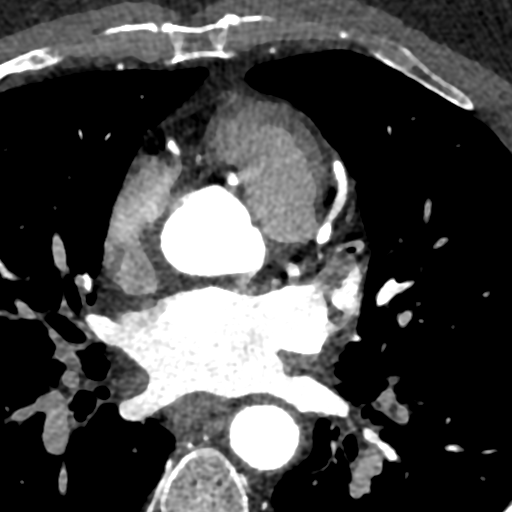

[3 of 20 positions shown; findings below may reference images not displayed]

:
A retrospective scan was triggered in the descending thoracic aorta.
Axial non-contrast 3 mm slices were carried out through the heart.
The data set was analyzed on a dedicated work station and scored
using the Agatson method. Gantry rotation speed was 330 msecs and
collimation was .6 mm. 50mg of metoprolol and 0.8 mg of sl NTG was
given. The 3D data set was reconstructed in 5% intervals of the
60-95 % of the R-R cycle. Diastolic phases were analyzed on a
dedicated work station using MPR, MIP and VRT modes. The patient
received 75 cc of contrast.
FINDINGS: Aorta:  Normal size.  No calcifications.  No dissection.

Aortic Valve:  Trileaflet.  No calcifications.

Coronary Arteries:  Normal coronary origin.  Right dominance.

RCA is a dominant artery that gives rise to PDA and PLA. There is no
plaque.

Left main is a large artery that gives rise to LAD and LCX arteries.

LAD has no plaque.

LCX is a non-dominant artery that gives rise to two obtuse marginal
branches. There is minimal calcification in the proximal LCx with no
obstruction (<25%).

Other findings:

Normal pulmonary vein drainage into the left atrium.

Normal left atrial appendage without a thrombus.

Normal size of the pulmonary artery.
IMPRESSION: 1. Coronary calcium score of 2.39. This was 32nd percentile for age
and sex matched control.

2. Normal coronary origin with right dominance.

3. Minimal proximal LCx calcification. No significant obstruction
(<25%)

4. CAD-RADS 1. Minimal non-obstructive CAD (0-24%). Consider
non-atherosclerotic causes of chest pain. Consider preventive
therapy and risk factor modification.

EXAM:
OVER-READ INTERPRETATION  CT CHEST

The following report is an over-read performed by radiologist Dr.
does not include interpretation of cardiac or coronary anatomy or
pathology. The coronary CTA interpretation by the cardiologist is
attached.
FINDINGS: Cardiovascular: Normal heart size. No significant pericardial
effusion/thickening. Atherosclerotic nonaneurysmal thoracic aorta.
Normal caliber pulmonary arteries. No central pulmonary emboli.

Mediastinum/Nodes: Unremarkable esophagus. No pathologically
enlarged mediastinal or hilar lymph nodes.

Lungs/Pleura: No pneumothorax. No pleural effusion. Mild
hypoventilatory changes in the dependent lungs. No acute
consolidative airspace disease, lung masses or significant pulmonary
nodules.

Upper abdomen: No acute abnormality.

Musculoskeletal: No aggressive appearing focal osseous lesions. Mild
thoracic spondylosis.
IMPRESSION: 1. No significant extracardiac findings.
2. Aortic Atherosclerosis ([id number]).

*** End of Addendum ***
:
A retrospective scan was triggered in the descending thoracic aorta.
Axial non-contrast 3 mm slices were carried out through the heart.
The data set was analyzed on a dedicated work station and scored
using the Agatson method. Gantry rotation speed was 330 msecs and
collimation was .6 mm. 50mg of metoprolol and 0.8 mg of sl NTG was
given. The 3D data set was reconstructed in 5% intervals of the
60-95 % of the R-R cycle. Diastolic phases were analyzed on a
dedicated work station using MPR, MIP and VRT modes. The patient
received 75 cc of contrast.
FINDINGS: Aorta:  Normal size.  No calcifications.  No dissection.

Aortic Valve:  Trileaflet.  No calcifications.

Coronary Arteries:  Normal coronary origin.  Right dominance.

RCA is a dominant artery that gives rise to PDA and PLA. There is no
plaque.

Left main is a large artery that gives rise to LAD and LCX arteries.

LAD has no plaque.

LCX is a non-dominant artery that gives rise to two obtuse marginal
branches. There is minimal calcification in the proximal LCx with no
obstruction (<25%).

Other findings:

Normal pulmonary vein drainage into the left atrium.

Normal left atrial appendage without a thrombus.

Normal size of the pulmonary artery.
IMPRESSION: 1. Coronary calcium score of 2.39. This was 32nd percentile for age
and sex matched control.

2. Normal coronary origin with right dominance.

3. Minimal proximal LCx calcification. No significant obstruction
(<25%)

4. CAD-RADS 1. Minimal non-obstructive CAD (0-24%). Consider
non-atherosclerotic causes of chest pain. Consider preventive
therapy and risk factor modification.

## 2022-08-20 ENCOUNTER — Encounter: Payer: Self-pay | Admitting: Cardiology

## 2023-11-06 LAB — LAB REPORT - SCANNED
A1c: 7.3
EGFR: 78

## 2023-11-24 ENCOUNTER — Telehealth: Payer: Self-pay

## 2023-11-24 ENCOUNTER — Other Ambulatory Visit: Payer: Self-pay

## 2023-11-24 DIAGNOSIS — Z860100 Personal history of colon polyps, unspecified: Secondary | ICD-10-CM

## 2023-11-24 MED ORDER — NA SULFATE-K SULFATE-MG SULF 17.5-3.13-1.6 GM/177ML PO SOLN: 1.0000 | Freq: Once | ORAL | 0 refills | Status: AC

## 2023-11-24 NOTE — Telephone Encounter (Signed)
 Gastroenterology Pre-Procedure Review  Request Date: 12/16/23 Requesting Physician: Dr. Therisa  PATIENT REVIEW QUESTIONS: The patient responded to the following health history questions as indicated:    1. Are you having any GI issues? no 2. Do you have a personal history of Polyps? yes (last colonoscopy performed by Dr. Therisa 07/20/18 recommended repeat in 5 years) 3. Do you have a family history of Colon Cancer or Polyps? no 4. Diabetes Mellitus? yes (takes metformin has been advised to stop 2 days prior to colonoscopy) 5. Joint replacements in the past 12 months?no 6. Major health problems in the past 3 months?no 7. Any artificial heart valves, MVP, or defibrillator?no    MEDICATIONS & ALLERGIES:    Patient reports the following regarding taking any anticoagulation/antiplatelet therapy:   Plavix, Coumadin, Eliquis, Xarelto, Lovenox, Pradaxa, Brilinta, or Effient? no Aspirin? yes (81 mg daily)  Patient confirms/reports the following medications:  Current Outpatient Medications  Medication Sig Dispense Refill   Ascorbic Acid (VITAMIN C) 1000 MG tablet Take 1,000 mg by mouth daily. AM     aspirin 81 MG tablet Take 81 mg by mouth daily. AM     atorvastatin  (LIPITOR) 10 MG tablet Take 1 tablet (10 mg total) by mouth daily. 90 tablet 3   calcium  citrate-vitamin D (CITRACAL+D) 315-200 MG-UNIT per tablet Take 1 tablet by mouth daily. AM     lisinopril (PRINIVIL,ZESTRIL) 5 MG tablet Take 5 mg by mouth daily. AM     metFORMIN (GLUMETZA) 500 MG (MOD) 24 hr tablet Take 500 mg by mouth 2 (two) times daily. AM AND PM     No current facility-administered medications for this visit.    Patient confirms/reports the following allergies:  No Known Allergies  No orders of the defined types were placed in this encounter.   AUTHORIZATION INFORMATION Primary Insurance: 1D#: Group #:  Secondary Insurance: 1D#: Group #:  SCHEDULE INFORMATION: Date: 12/16/23 Time: Location: [facility]

## 2023-11-24 NOTE — Telephone Encounter (Signed)
[phone] Per pt his wife told him that someone call about a colonoscopy. I didn't see a note that anyone call pt. Pt ask that we call [phone] if he needs to make appt.

## 2023-12-08 ENCOUNTER — Encounter: Payer: Self-pay | Admitting: Internal Medicine

## 2023-12-15 ENCOUNTER — Encounter: Payer: Self-pay | Admitting: Gastroenterology

## 2023-12-16 ENCOUNTER — Encounter
Admission: RE | Disposition: A | Discharge status: Home / Self Care | Payer: Self-pay | Source: Home / Self Care | Attending: Gastroenterology

## 2023-12-16 ENCOUNTER — Ambulatory Visit: Payer: Medicare HMO | Admitting: Anesthesiology

## 2023-12-16 ENCOUNTER — Ambulatory Visit
Admission: RE | Admit: 2023-12-16 | Discharge: 2023-12-16 | Disposition: A | Discharge status: Home / Self Care | Payer: Medicare HMO | Attending: Gastroenterology | Admitting: Gastroenterology

## 2023-12-16 DIAGNOSIS — Z860101 Personal history of adenomatous and serrated colon polyps: Secondary | ICD-10-CM | POA: Insufficient documentation

## 2023-12-16 DIAGNOSIS — K5730 Diverticulosis of large intestine without perforation or abscess without bleeding: Secondary | ICD-10-CM | POA: Diagnosis not present

## 2023-12-16 DIAGNOSIS — K219 Gastro-esophageal reflux disease without esophagitis: Secondary | ICD-10-CM | POA: Insufficient documentation

## 2023-12-16 DIAGNOSIS — Z79899 Other long term (current) drug therapy: Secondary | ICD-10-CM | POA: Insufficient documentation

## 2023-12-16 DIAGNOSIS — Z87891 Personal history of nicotine dependence: Secondary | ICD-10-CM | POA: Diagnosis not present

## 2023-12-16 DIAGNOSIS — Z1211 Encounter for screening for malignant neoplasm of colon: Secondary | ICD-10-CM | POA: Insufficient documentation

## 2023-12-16 DIAGNOSIS — Z7984 Long term (current) use of oral hypoglycemic drugs: Secondary | ICD-10-CM | POA: Insufficient documentation

## 2023-12-16 DIAGNOSIS — E119 Type 2 diabetes mellitus without complications: Secondary | ICD-10-CM | POA: Diagnosis not present

## 2023-12-16 DIAGNOSIS — Z860100 Personal history of colon polyps, unspecified: Secondary | ICD-10-CM

## 2023-12-16 HISTORY — PX: COLONOSCOPY WITH PROPOFOL: SHX5780

## 2023-12-16 LAB — GLUCOSE, CAPILLARY: Glucose-Capillary: 185 mg/dL — ABNORMAL HIGH (ref 70–99)

## 2023-12-16 SURGERY — COLONOSCOPY WITH PROPOFOL
Anesthesia: General

## 2023-12-16 MED ORDER — LIDOCAINE HCL (PF) 2 % IJ SOLN
INTRAMUSCULAR | Status: AC
  Filled 2023-12-16: qty 10

## 2023-12-16 MED ORDER — SODIUM CHLORIDE 0.9 % IV SOLN
INTRAVENOUS | Status: DC
  Administered 2023-12-16: 20 mL/h via INTRAVENOUS

## 2023-12-16 MED ORDER — EPHEDRINE 5 MG/ML INJ
INTRAVENOUS | Status: AC
  Filled 2023-12-16: qty 5

## 2023-12-16 MED ORDER — PROPOFOL 500 MG/50ML IV EMUL
INTRAVENOUS | Status: DC | PRN
  Administered 2023-12-16: 75 ug/kg/min via INTRAVENOUS

## 2023-12-16 MED ORDER — PROPOFOL 10 MG/ML IV BOLUS
INTRAVENOUS | Status: DC | PRN
  Administered 2023-12-16: 50 mg via INTRAVENOUS
  Administered 2023-12-16: 40 mg via INTRAVENOUS

## 2023-12-16 MED ORDER — LIDOCAINE HCL (CARDIAC) PF 100 MG/5ML IV SOSY
PREFILLED_SYRINGE | INTRAVENOUS | Status: DC | PRN
  Administered 2023-12-16: 60 mg via INTRAVENOUS

## 2023-12-16 MED ORDER — DEXMEDETOMIDINE HCL IN NACL 80 MCG/20ML IV SOLN
INTRAVENOUS | Status: DC | PRN
Start: 2023-12-16 — End: 2023-12-16
  Administered 2023-12-16: 20 ug via INTRAVENOUS

## 2023-12-16 MED ORDER — EPHEDRINE SULFATE-NACL 50-0.9 MG/10ML-% IV SOSY
PREFILLED_SYRINGE | INTRAVENOUS | Status: DC | PRN
  Administered 2023-12-16: 10 mg via INTRAVENOUS
  Administered 2023-12-16: 15 mg via INTRAVENOUS

## 2023-12-16 NOTE — H&P (Signed)
Wyline Mood, MD [address], [address], [place], [place], [address] [address], [place], [place], [address] Phone: [phone]  Fax: [phone]  Primary Care Physician:  Sherrie Mustache, MD   Pre-Procedure History & Physical: HPI:  Matthew Dean. is a 73 y.o. male is here for an colonoscopy.   Past Medical History:  Diagnosis Date   Colonic polyp 2007   [facility] ?MD   Diabetes mellitus without complication ([facility])    ORAL MEDS   ETOH abuse    QUIT FEB 2001   GERD (gastroesophageal reflux disease)    INDIGESTION   Gout    Hx of cocaine abuse ([facility])    QUIT FEB 2001   Hyperlipidemia     Past Surgical History:  Procedure Laterality Date   COLONOSCOPY WITH PROPOFOL N/A 06/04/2015   Procedure: COLONOSCOPY WITH PROPOFOL;  Surgeon: Midge Minium, MD;  Location: [facility];  Service: Endoscopy;  Laterality: N/A;  ORAL MED DIABETIC   COLONOSCOPY WITH PROPOFOL N/A 07/20/2018   Procedure: COLONOSCOPY WITH PROPOFOL;  Surgeon: Wyline Mood, MD;  [facility];  Service: Gastroenterology;  Laterality: N/A;   ESOPHAGOGASTRODUODENOSCOPY (EGD) WITH PROPOFOL N/A 06/04/2015   Procedure: ESOPHAGOGASTRODUODENOSCOPY (EGD) WITH PROPOFOL;  Surgeon: Midge Minium, MD;  Location[facility];  Service: Endoscopy;  Laterality: N/A;   POLYPECTOMY  06/04/2015   Procedure: POLYPECTOMY;  Surgeon: Midge Minium, MD;  Location: [facility];  Service: Endoscopy;;    Prior to Admission medications   Medication Sig Start Date End Date Taking? Authorizing Provider  Ascorbic Acid (VITAMIN C) 1000 MG tablet Take 1,000 mg by mouth daily. AM    [provider]  aspirin 81 MG tablet Take 81 mg by mouth daily. AM    [provider]  atorvastatin (LIPITOR) 10 MG tablet Take 1 tablet (10 mg total) by mouth daily. 05/09/21 08/07/21  Debbe Odea, MD  calcium citrate-vitamin D (CITRACAL+D) 315-200 MG-UNIT per tablet Take 1 tablet by mouth daily. AM     [provider]  lisinopril (PRINIVIL,ZESTRIL) 5 MG tablet Take 5 mg by mouth daily. AM    [provider]  metFORMIN (GLUMETZA) 500 MG (MOD) 24 hr tablet Take 500 mg by mouth 2 (two) times daily. AM AND PM    [provider]    Allergies as of 11/24/2023   (No Known Allergies)    Family History  Problem Relation Age of Onset   Hypertension Mother    Diabetes Mother    Asthma Father    Stroke Father    Diabetes Father    Cirrhosis Father        ?weekend ETOH?other etiology   Colon cancer Neg Hx     Social History   Socioeconomic History   Marital status: Married    Spouse name: Not on file   Number of children: 0   Years of education: Not on file   Highest education level: Not on file  Occupational History   Not on file  Tobacco Use   Smoking status: Former    Current packs/day: 0.00    Average packs/day: 1 pack/day for 30.0 years (30.0 ttl pk-yrs)    Types: Cigarettes    Start date: 12/03/1983    Quit date: 12/02/2013    Years since quitting: 10.0   Smokeless tobacco: Never  Vaping Use   Vaping status: Never Used  Substance and Sexual Activity   Alcohol use: Not Currently    Alcohol/week: 0.0 standard drinks  of alcohol   Drug use: Not Currently    Comment: Recovered cocaine/heroin/marijuana/acid   Sexual activity: Not on file  Other Topics Concern   Not on file  Social History Narrative   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 General:   Alert,  pleasant and cooperative in NAD Head:  Normocephalic and atraumatic. Neck:  Supple; no masses or  thyromegaly. Lungs:  Clear throughout to auscultation, normal respiratory effort.    Heart:  +S1, +S2, Regular rate and rhythm, No edema. Abdomen:  Soft, nontender and nondistended. Normal bowel sounds, without guarding, and without rebound.   Neurologic:  Alert and  oriented x4;  grossly normal neurologically.  Impression/Plan: Matthew Dean. is here for an colonoscopy to be performed for surveillance due to prior history of colon polyps   Risks, benefits, limitations, and alternatives regarding  colonoscopy have been reviewed with the patient.  Questions have been answered.  All parties agreeable.   Wyline Mood, MD  12/16/2023, 10:35 AM

## 2023-12-16 NOTE — Transfer of Care (Signed)
Immediate Anesthesia Transfer of Care Note  Patient: Matthew Dean.  Procedure(s) Performed: COLONOSCOPY WITH PROPOFOL  Patient Location: PACU  Anesthesia Type:General  Level of Consciousness: sedated  Airway & Oxygen Therapy: Patient Spontanous Breathing  Post-op Assessment: Report given to RN and Post -op Vital signs reviewed and stable  Post vital signs: Reviewed and stable  Last Vitals:  Vitals Value Taken Time  BP    Temp    Pulse 79 12/16/23 1144  Resp 20 12/16/23 1144  SpO2 98 % 12/16/23 1144  Vitals shown include unfiled device data.  Last Pain:  Vitals:   12/16/23 1040  TempSrc: Temporal  PainSc: 0-No pain         Complications: No notable events documented.

## 2023-12-16 NOTE — Anesthesia Preprocedure Evaluation (Signed)
Anesthesia Evaluation  Patient identified by MRN, date of birth, ID band Patient awake    Reviewed: Allergy & Precautions, NPO status , Patient's Chart, lab work & pertinent test results, reviewed documented beta blocker date and time   History of Anesthesia Complications Negative for: history of anesthetic complications  Airway Mallampati: II  TM Distance: >3 FB Neck ROM: Full    Dental no notable dental hx. (+) Upper Dentures, Lower Dentures   Pulmonary neg pulmonary ROS, neg sleep apnea, neg COPD, Patient abstained from smoking.Not current smoker, former smoker   Pulmonary exam normal breath sounds clear to auscultation       Cardiovascular Exercise Tolerance: Good METS(-) hypertension(-) CAD and (-) Past MI negative cardio ROS (-) dysrhythmias  Rhythm:Regular Rate:Normal - Systolic murmurs    Neuro/Psych  PSYCHIATRIC DISORDERS Anxiety     negative neurological ROS     GI/Hepatic ,GERD  Controlled,,(+)     (-) substance abuse    Endo/Other  diabetes, Well Controlled, Type 2, Oral Hypoglycemic Agents    Renal/GU negative Renal ROS     Musculoskeletal   Abdominal   Peds  Hematology   Anesthesia Other Findings Past Medical History: 2007: Colonic polyp     Comment:  Marcy Panning ?MD No date: Diabetes mellitus without complication (HCC)     Comment:  ORAL MEDS No date: ETOH abuse     Comment:  QUIT FEB 2001 No date: GERD (gastroesophageal reflux disease)     Comment:  INDIGESTION No date: Gout No date: Hx of cocaine abuse (HCC)     Comment:  QUIT FEB 2001 No date: Hyperlipidemia  Reproductive/Obstetrics                             Anesthesia Physical Anesthesia Plan  ASA: 2  Anesthesia Plan: General   Post-op Pain Management: Minimal or no pain anticipated   Induction: Intravenous  PONV Risk Score and Plan: 2 and Propofol infusion, TIVA and Ondansetron  Airway Management  Planned: Nasal Cannula  Additional Equipment: None  Intra-op Plan:   Post-operative Plan:   Informed Consent: I have reviewed the patients History and Physical, chart, labs and discussed the procedure including the risks, benefits and alternatives for the proposed anesthesia with the patient or authorized representative who has indicated his/her understanding and acceptance.     Dental advisory given  Plan Discussed with: CRNA  Anesthesia Plan Comments: (Discussed risks of anesthesia with patient, including possibility of difficulty with spontaneous ventilation under anesthesia necessitating airway intervention, PONV, and rare risks such as cardiac or respiratory or neurological events, and allergic reactions. Discussed the role of CRNA in patient's perioperative care. Patient understands.)        Anesthesia Quick Evaluation

## 2023-12-16 NOTE — Anesthesia Postprocedure Evaluation (Signed)
Anesthesia Post Note  Patient: Matthew Dean.  Procedure(s) Performed: COLONOSCOPY WITH PROPOFOL  Patient location during evaluation: Endoscopy Anesthesia Type: General Level of consciousness: awake and alert Pain management: pain level controlled Vital Signs Assessment: post-procedure vital signs reviewed and stable Respiratory status: spontaneous breathing, nonlabored ventilation, respiratory function stable and patient connected to nasal cannula oxygen Cardiovascular status: blood pressure returned to baseline and stable Postop Assessment: no apparent nausea or vomiting Anesthetic complications: no   No notable events documented.   Last Vitals:  Vitals:   12/16/23 1040 12/16/23 1144  BP: 117/64 (!) 99/43  Pulse: (!) 56 79  Resp: 20 20  Temp: 36.4 C (!) 36.1 C  SpO2: 100% 98%    Last Pain:  Vitals:   12/16/23 1204  TempSrc:   PainSc: 0-No pain                 Corinda Gubler

## 2023-12-16 NOTE — Op Note (Signed)
[facility] Gastroenterology Patient Name: Matthew Dean Procedure Date: 12/16/2023 11:24 AM MRN: [id number] Account #: [id number] Date of Birth: 20-Jun-1951 Admit Type: Outpatient Age: 73 Room: [facility] ENDO ROOM 3 Gender: Male Note Status: Finalized Instrument Name: Nelda Marseille [id number] Procedure:             Colonoscopy Indications:           Surveillance: Personal history of adenomatous polyps                         on last colonoscopy > 3 years ago, Last colonoscopy:                         August 2019 Providers:             Wyline Mood MD, MD Medicines:             Monitored Anesthesia Care Complications:         No immediate complications. Procedure:             Pre-Anesthesia Assessment:                        - Prior to the procedure, a History and Physical was                         performed, and patient medications, allergies and                         sensitivities were reviewed. The patient's tolerance                         of previous anesthesia was reviewed.                        - The risks and benefits of the procedure and the                         sedation options and risks were discussed with the                         patient. All questions were answered and informed                         consent was obtained.                        - ASA Grade Assessment: II - A patient with mild                         systemic disease.                        After obtaining informed consent, the colonoscope was                         passed under direct vision. Throughout the procedure,                         the patient's blood pressure, pulse, and oxygen  saturations were monitored continuously. The                         Colonoscope was introduced through the anus and                         advanced to the the cecum, identified by the                         appendiceal orifice. The colonoscopy was performed                          with ease. The patient tolerated the procedure well.                         The quality of the bowel preparation was excellent.                         The ileocecal valve, appendiceal orifice, and rectum                         were photographed. Findings:      The perianal and digital rectal examinations were normal.      Multiple medium-mouthed diverticula were found in the left colon.      The exam was otherwise without abnormality on direct and retroflexion       views. Impression:            - Diverticulosis in the left colon.                        - The examination was otherwise normal on direct and                         retroflexion views.                        - No specimens collected. Recommendation:        - Discharge patient to home (with escort).                        - Resume previous diet.                        - Continue present medications.                        - Repeat colonoscopy is not recommended due to current                         age (81 years or older) for surveillance. Procedure Code(s):     --- Professional ---                        [phone], Colonoscopy, flexible; diagnostic, including                         collection of specimen(s) by brushing or washing, when                         performed (separate  procedure) Diagnosis Code(s):     --- Professional ---                        Z86.010, Personal history of colonic polyps                        K57.30, Diverticulosis of large intestine without                         perforation or abscess without bleeding CPT copyright 2022 American Medical Association. All rights reserved. The codes documented in this report are preliminary and upon coder review may  be revised to meet current compliance requirements. Wyline Mood, MD Wyline Mood MD, MD 12/16/2023 11:42:52 AM This report has been signed electronically. Number of Addenda: 0 Note Initiated On: 12/16/2023 11:24 AM Scope Withdrawal  Time: 0 hours 6 minutes 18 seconds  Total Procedure Duration: 0 hours 8 minutes 50 seconds  Estimated Blood Loss:  Estimated blood loss: none.      [facility]

## 2023-12-17 ENCOUNTER — Encounter: Payer: Self-pay | Admitting: Gastroenterology

## 2023-12-22 ENCOUNTER — Other Ambulatory Visit: Payer: Self-pay | Admitting: Internal Medicine

## 2023-12-22 DIAGNOSIS — E069 Thyroiditis, unspecified: Secondary | ICD-10-CM

## 2024-01-04 ENCOUNTER — Encounter
Admission: RE | Admit: 2024-01-04 | Discharge: 2024-01-04 | Disposition: A | Discharge status: Home / Self Care | Payer: Medicare HMO | Source: Ambulatory Visit | Attending: Internal Medicine | Admitting: Internal Medicine

## 2024-01-04 DIAGNOSIS — E069 Thyroiditis, unspecified: Secondary | ICD-10-CM | POA: Insufficient documentation

## 2024-01-06 ENCOUNTER — Ambulatory Visit: Payer: Medicare HMO | Admitting: Urology

## 2024-01-07 ENCOUNTER — Ambulatory Visit: Payer: Medicare HMO | Admitting: Urology

## 2024-01-07 ENCOUNTER — Encounter: Payer: Self-pay | Admitting: Urology

## 2024-01-07 VITALS — BP 117/73 | HR 68 | Ht 68.0 in | Wt 155.0 lb

## 2024-01-07 DIAGNOSIS — R972 Elevated prostate specific antigen [PSA]: Secondary | ICD-10-CM | POA: Diagnosis not present

## 2024-01-07 NOTE — Progress Notes (Signed)
I, Matthew Dean, acting as a scribe for Riki Altes, MD., have documented all relevant documentation on the behalf of Riki Altes, MD, as directed by Riki Altes, MD while in the presence of Riki Altes, MD.  01/07/2024 11:46 AM   Matthew Dean. 31-Aug-1951 [id number]  Referring provider: Sherrie Mustache, MD [address] [place],  [place] [address]  Chief Complaint  Patient presents with   Elevated PSA    HPI: Matthew Dean. is a 73 y.o. male referred for evaluation of an elevated PSA.   PSA drawn 11/06/23 was 4.2. No previous PSA results were sent for comparison.  He has no bothersome lower urinary tract symptoms.  Denies dysuria, gross hematuria or recurrent UTIs.  Denies family history of prostate cancer.  No previous history of urologic problems or prior urologic evaluation.    PMH: Past Medical History:  Diagnosis Date   Colonic polyp 2007   [facility] ?MD   Diabetes mellitus without complication (HCC)    ORAL MEDS   ETOH abuse    QUIT FEB 2001   GERD (gastroesophageal reflux disease)    INDIGESTION   Gout    Hx of cocaine abuse (HCC)    QUIT FEB 2001   Hyperlipidemia     Surgical History: Past Surgical History:  Procedure Laterality Date   COLONOSCOPY WITH PROPOFOL N/A 06/04/2015   Procedure: COLONOSCOPY WITH PROPOFOL;  Surgeon: Midge Minium, MD;  Location: [facility];  Service: Endoscopy;  Laterality: N/A;  ORAL MED DIABETIC   COLONOSCOPY WITH PROPOFOL N/A 07/20/2018   Procedure: COLONOSCOPY WITH PROPOFOL;  Surgeon: Wyline Mood, MD;  Location: [facility] ENDOSCOPY;  Service: Gastroenterology;  Laterality: N/A;   COLONOSCOPY WITH PROPOFOL N/A 12/16/2023   Procedure: COLONOSCOPY WITH PROPOFOL;  Surgeon: Wyline Mood, MD;  Location: [facility] ENDOSCOPY;  Service: Gastroenterology;  Laterality: N/A;   ESOPHAGOGASTRODUODENOSCOPY (EGD) WITH PROPOFOL N/A 06/04/2015   Procedure: ESOPHAGOGASTRODUODENOSCOPY (EGD) WITH PROPOFOL;  Surgeon:  Midge Minium, MD;  Location: [facility]ERY [facility];  Service: Endoscopy;  Laterality: N/A;   POLYPECTOMY  06/04/2015   Procedure: POLYPECTOMY;  Surgeon: Midge Minium, MD;  Location: [facility];  Service: Endoscopy;;    Home Medications:  Allergies as of 01/07/2024   No Known Allergies      Medication List        Accurate as of January 07, 2024 11:46 AM. If you have any questions, ask your nurse or doctor.          aspirin 81 MG tablet Take 81 mg by mouth daily. AM   atorvastatin 10 MG tablet Commonly known as: LIPITOR Take 1 tablet (10 mg total) by mouth daily.   calcium citrate-vitamin D 315-200 MG-UNIT tablet Commonly known as: CITRACAL+D Take 1 tablet by mouth daily. AM   lisinopril 5 MG tablet Commonly known as: ZESTRIL Take 5 mg by mouth daily. AM   metFORMIN 500 MG (MOD) 24 hr tablet Commonly known as: GLUMETZA Take 500 mg by mouth 2 (two) times daily. AM AND PM   vitamin C 1000 MG tablet Take 1,000 mg by mouth daily. AM        Allergies: No Known Allergies  Family History: Family History  Problem Relation Age of Onset   Hypertension Mother    Diabetes Mother    Asthma Father    Stroke Father    Diabetes Father    Cirrhosis Father        ?weekend ETOH?other etiology  Colon cancer Neg Hx     Social History:  reports that he quit smoking about 10 years ago. His smoking use included cigarettes. He started smoking about 40 years ago. He has a 30 pack-year smoking history. He has never used smokeless tobacco. He reports that he does not currently use alcohol. He reports that he does not currently use drugs.   Physical Exam: BP 117/73   Pulse 68   Ht 5\' 8"  (1.727 m)   Wt 155 lb (70.3 kg)   BMI 23.57 kg/m   Constitutional:  Alert and oriented, No acute distress. HEENT: [place] AT Respiratory: Normal respiratory effort, no increased work of breathing. GI: Abdomen is soft, nontender, nondistended, no abdominal masses GU: Prostate 40 grams, some  asymmetry present left>right. However, the consistency is normal and uniform throughout. Psychiatric: Normal mood and affect.  Assessment & Plan:    1. Elevated PSA PSA is normal by age-specific guidelines and modally elevated by strict criteria. Previous PSA results over the last 3 years for comparison We discussed possibility of transient PSA elevation secondary inflammation, and PSA was repeated today.  Although PSA is a prostate cancer screening test he was informed that cancer is not the most common cause of an elevated PSA. Other potential causes including BPH and inflammation were discussed.  He was informed that the only way to adequately diagnose prostate cancer would be transrectal ultrasound and biopsy of the prostate. The procedure was discussed including potential risks of bleeding and infection/sepsis. He was also informed that a negative biopsy does not conclusively rule out the possibility that prostate cancer may be present and that continued monitoring is required.  The use of newer adjunctive blood and urine tests to predict the probability of high-grade prostate cancer were discussed.  The use of multiparametric prostate MRI to evaluate for lesions suspicious for high grade prostate cancer and aid in targeted bx was reviewed.  Continued periodic surveillance was also discussed.  If PSA persistently elevated, will schedule a prostate MRI.   I have reviewed the above documentation for accuracy and completeness, and I agree with the above.   Riki Altes, MD  [facility] [address] [place], [place] [address] [phone]

## 2024-01-08 ENCOUNTER — Encounter: Payer: Self-pay | Admitting: *Deleted

## 2024-01-08 LAB — PSA: Prostate Specific Ag, Serum: 4 ng/mL (ref 0.0–4.0)

## 2024-01-08 NOTE — Telephone Encounter (Signed)
Patient returned call; and message was relayed that PSA is at 4.0. Patient verbalized understanding.

## 2024-01-12 ENCOUNTER — Encounter
Admission: RE | Admit: 2024-01-12 | Discharge: 2024-01-12 | Disposition: A | Discharge status: Home / Self Care | Payer: Medicare HMO | Source: Ambulatory Visit | Attending: Internal Medicine | Admitting: Internal Medicine

## 2024-01-12 DIAGNOSIS — E069 Thyroiditis, unspecified: Secondary | ICD-10-CM | POA: Diagnosis present

## 2024-01-12 MED ORDER — SODIUM IODIDE I-123 7.4 MBQ CAPS
470.0000 | ORAL_CAPSULE | Freq: Once | ORAL | Status: AC
  Administered 2024-01-12: 470 via ORAL

## 2024-01-13 ENCOUNTER — Encounter
Admission: RE | Admit: 2024-01-13 | Discharge: 2024-01-13 | Disposition: A | Discharge status: Home / Self Care | Payer: Medicare HMO | Source: Ambulatory Visit | Attending: Internal Medicine | Admitting: Internal Medicine

## 2024-06-17 ENCOUNTER — Ambulatory Visit (INDEPENDENT_AMBULATORY_CARE_PROVIDER_SITE_OTHER): Admitting: Internal Medicine

## 2024-06-17 ENCOUNTER — Encounter: Payer: Self-pay | Admitting: Internal Medicine

## 2024-06-17 VITALS — BP 120/66 | HR 57 | Ht 68.0 in | Wt 163.4 lb

## 2024-06-17 DIAGNOSIS — E1169 Type 2 diabetes mellitus with other specified complication: Secondary | ICD-10-CM | POA: Diagnosis not present

## 2024-06-17 DIAGNOSIS — E1159 Type 2 diabetes mellitus with other circulatory complications: Secondary | ICD-10-CM

## 2024-06-17 DIAGNOSIS — Z8639 Personal history of other endocrine, nutritional and metabolic disease: Secondary | ICD-10-CM

## 2024-06-17 DIAGNOSIS — Z860100 Personal history of colon polyps, unspecified: Secondary | ICD-10-CM

## 2024-06-17 DIAGNOSIS — E782 Mixed hyperlipidemia: Secondary | ICD-10-CM

## 2024-06-17 DIAGNOSIS — E1165 Type 2 diabetes mellitus with hyperglycemia: Secondary | ICD-10-CM | POA: Diagnosis not present

## 2024-06-17 DIAGNOSIS — I152 Hypertension secondary to endocrine disorders: Secondary | ICD-10-CM

## 2024-06-17 MED ORDER — SILDENAFIL CITRATE 100 MG PO TABS: 100.0000 mg | ORAL_TABLET | Freq: Every day | ORAL | 3 refills | Status: AC | PRN

## 2024-06-17 NOTE — Progress Notes (Signed)
 New Patient Office Visit  Subjective   Patient ID: Matthew Pham., male    DOB: [birth year]  Age: 73 y.o. MRN: [id number]  CC:  Chief Complaint  Patient presents with   Establish Care    NPE    HPI Matthew Dean. presents to establish care Previous Primary Care provider/office:   he does not have additional concerns to discuss today.   Patient comes in to establish PMD.  He has history of diabetes, hypertension and high cholesterol.  He stopped smoking more than 15 years ago.  He has history of colon polyps and had a colonoscopy in January 2025.  The next one is due in 3 years.  He has been evaluated by the urologist for elevated PSA but will be monitored closely.  He generally feels well and has no new complaints.  He takes his medications regularly.  He is fasting for blood work.    Outpatient Encounter Medications as of 06/17/2024  Medication Sig   Ascorbic Acid (VITAMIN C) 1000 MG tablet Take 1,000 mg by mouth daily. AM   aspirin 81 MG tablet Take 81 mg by mouth daily. AM   atorvastatin  (LIPITOR) 10 MG tablet Take 1 tablet (10 mg total) by mouth daily.   calcium  citrate-vitamin D (CITRACAL+D) 315-200 MG-UNIT per tablet Take 1 tablet by mouth daily. AM   lisinopril (PRINIVIL,ZESTRIL) 5 MG tablet Take 5 mg by mouth daily. AM   metFORMIN (GLUMETZA) 500 MG (MOD) 24 hr tablet Take 500 mg by mouth 2 (two) times daily. AM AND PM   [DISCONTINUED] sildenafil (VIAGRA) 100 MG tablet Take 100 mg by mouth daily as needed for erectile dysfunction.   sildenafil (VIAGRA) 100 MG tablet Take 1 tablet (100 mg total) by mouth daily as needed for erectile dysfunction.   No facility-administered encounter medications on file as of 06/17/2024.    Past Medical History:  Diagnosis Date   Colonic polyp 2007   [facility] ?MD   Diabetes mellitus without complication (HCC)    ORAL MEDS   ETOH abuse    QUIT FEB 2001   GERD (gastroesophageal reflux disease)    INDIGESTION   Gout     Hx of cocaine abuse (HCC)    QUIT FEB 2001   Hyperlipidemia     Past Surgical History:  Procedure Laterality Date   COLONOSCOPY WITH PROPOFOL  N/A 06/04/2015   Procedure: COLONOSCOPY WITH PROPOFOL ;  Surgeon: Rogelia Copping, MD;  Location: [facility];  Service: Endoscopy;  Laterality: N/A;  ORAL MED DIABETIC   COLONOSCOPY WITH PROPOFOL  N/A 07/20/2018   Procedure: COLONOSCOPY WITH PROPOFOL ;  Surgeon: Therisa Bi, MD;  Location: [facility] ENDOSCOPY;  Service: Gastroenterology;  Laterality: N/A;   COLONOSCOPY WITH PROPOFOL  N/A 12/16/2023   Procedure: COLONOSCOPY WITH PROPOFOL ;  Surgeon: Therisa Bi, MD;  Location: [facility] ENDOSCOPY;  Service: Gastroenterology;  Laterality: N/A;   ESOPHAGOGASTRODUODENOSCOPY (EGD) WITH PROPOFOL  N/A 06/04/2015   Procedure: ESOPHAGOGASTRODUODENOSCOPY (EGD) WITH PROPOFOL ;  Surgeon: Rogelia Copping, MD;  Location: [facility]ERY [facility];  Service: Endoscopy;  Laterality: N/A;   POLYPECTOMY  06/04/2015   Procedure: POLYPECTOMY;  Surgeon: Rogelia Copping, MD;  Location: [facility];  Service: Endoscopy;;    Family History  Problem Relation Age of Onset   Hypertension Mother    Diabetes Mother    Asthma Father    Stroke Father    Diabetes Father    Cirrhosis Father        ?weekend ETOH?other etiology   Colon cancer Neg Hx  Social History   Socioeconomic History   Marital status: Married    Spouse name: Not on file   Number of children: 0   Years of education: Not on file   Highest education level: Not on file  Occupational History   Not on file  Tobacco Use   Smoking status: Former    Current packs/day: 0.00    Average packs/day: 1 pack/day for 30.0 years (30.0 ttl pk-yrs)    Types: Cigarettes    Start date: 12/03/1983    Quit date: 12/02/2013    Years since quitting: 10.5   Smokeless tobacco: Never  Vaping Use   Vaping status: Never Used  Substance and Sexual Activity   Alcohol use: Not Currently    Alcohol/week: 0.0 standard drinks of alcohol   Drug  use: Not Currently    Comment: Recovered cocaine/heroin/marijuana/acid   Sexual activity: Not on file  Other Topics Concern   Not on file  Social History Narrative   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  Negative for chills, diaphoresis, fever, malaise/fatigue and weight loss.  HENT: Negative.  Negative for sore throat.   Eyes: Negative.   Respiratory: Negative.  Negative for cough and shortness of breath.   Cardiovascular: Negative.  Negative for chest pain, palpitations and leg swelling.  Gastrointestinal: Negative.  Negative for abdominal pain, constipation, diarrhea, heartburn, nausea and vomiting.  Genitourinary: Negative.  Negative for dysuria and flank pain.  Musculoskeletal: Negative.  Negative for joint pain and myalgias.  Skin: Negative.   Neurological: Negative.  Negative for dizziness and headaches.  Endo/Heme/Allergies: Negative.   Psychiatric/Behavioral: Negative.  Negative for depression and suicidal ideas. The patient is not nervous/anxious.         Objective   BP 120/66   Pulse (!) 57   Ht 5' 8 (1.727 m)   Wt 163 lb 6.4 oz (74.1 kg)   SpO2 98%   BMI 24.84 kg/m   Physical Exam Vitals and nursing note reviewed.  Constitutional:      Appearance: Normal appearance.  HENT:     Head: Normocephalic and atraumatic.     Nose: Nose normal.     Mouth/Throat:     Mouth: Mucous membranes are moist.     Pharynx: Oropharynx is clear.  Eyes:     Conjunctiva/sclera: Conjunctivae normal.     Pupils: Pupils are equal, round, and reactive to light.  Cardiovascular:     Rate and Rhythm: Normal rate and regular rhythm.     Pulses: Normal pulses.      Heart sounds: Normal heart sounds.  Pulmonary:     Effort: Pulmonary effort is normal.     Breath sounds: Normal breath sounds.  Abdominal:     General: Bowel sounds are normal.     Palpations: Abdomen is soft.  Musculoskeletal:        General: Normal range of motion.     Cervical back: Normal range of motion.  Skin:    General: Skin is warm and dry.  Neurological:     General: No focal deficit present.     Mental Status: He is alert and oriented to person,  place, and time.  Psychiatric:        Mood and Affect: Mood normal.        Behavior: Behavior normal.        Judgment: Judgment normal.        Assessment & Plan:  Continue current medications.  Check blood work today. Problem List Items Addressed This Visit     History of colonic polyps   Relevant Orders   CBC with Diff   Hypertension associated with diabetes (HCC) - Primary   Relevant Medications   sildenafil (VIAGRA) 100 MG tablet   Other Relevant Orders   CMP14+EGFR   CBC with Diff   Type 2 diabetes mellitus with hyperglycemia, without long-term current use of insulin (HCC)   Relevant Orders   Hemoglobin A1c   Combined hyperlipidemia associated with type 2 diabetes mellitus (HCC)   Relevant Medications   sildenafil (VIAGRA) 100 MG tablet   Other Relevant Orders   Lipid Panel w/o Chol/HDL Ratio   History of thyroiditis   Relevant Orders   TSH    Return in about 3 months (around 09/17/2024).   Total time spent: 30 minutes  FERNAND FREDY RAMAN, MD  06/17/2024   This document may have been prepared by [facility] Voice Recognition software and as such may include unintentional dictation errors.

## 2024-06-18 LAB — CMP14+EGFR
ALT: 37 IU/L (ref 0–44)
AST: 25 IU/L (ref 0–40)
Albumin: 4.3 g/dL (ref 3.8–4.8)
Alkaline Phosphatase: 116 IU/L (ref 44–121)
BUN/Creatinine Ratio: 17 (ref 10–24)
BUN: 16 mg/dL (ref 8–27)
Bilirubin Total: 0.6 mg/dL (ref 0.0–1.2)
CO2: 22 mmol/L (ref 20–29)
Calcium: 9.8 mg/dL (ref 8.6–10.2)
Chloride: 103 mmol/L (ref 96–106)
Creatinine, Ser: 0.93 mg/dL (ref 0.76–1.27)
Globulin, Total: 2.5 g/dL (ref 1.5–4.5)
Glucose: 150 mg/dL — ABNORMAL HIGH (ref 70–99)
Potassium: 4.4 mmol/L (ref 3.5–5.2)
Sodium: 138 mmol/L (ref 134–144)
Total Protein: 6.8 g/dL (ref 6.0–8.5)
eGFR: 86 mL/min/1.73 (ref >59)

## 2024-06-18 LAB — CBC WITH DIFFERENTIAL/PLATELET
Basophils Absolute: 0.1 x10E3/uL (ref 0.0–0.2)
Basos: 1 %
EOS (ABSOLUTE): 0.3 x10E3/uL (ref 0.0–0.4)
Eos: 3 %
Hematocrit: 38.5 % (ref 37.5–51.0)
Hemoglobin: 12.5 g/dL — ABNORMAL LOW (ref 13.0–17.7)
Immature Grans (Abs): 0 x10E3/uL (ref 0.0–0.1)
Immature Granulocytes: 0 %
Lymphocytes Absolute: 2.4 x10E3/uL (ref 0.7–3.1)
Lymphs: 32 %
MCH: 28.9 pg (ref 26.6–33.0)
MCHC: 32.5 g/dL (ref 31.5–35.7)
MCV: 89 fL (ref 79–97)
Monocytes Absolute: 0.6 x10E3/uL (ref 0.1–0.9)
Monocytes: 7 %
Neutrophils Absolute: 4.3 x10E3/uL (ref 1.4–7.0)
Neutrophils: 57 %
Platelets: 223 x10E3/uL (ref 150–450)
RBC: 4.33 x10E6/uL (ref 4.14–5.80)
RDW: 13.1 % (ref 11.6–15.4)
WBC: 7.7 x10E3/uL (ref 3.4–10.8)

## 2024-06-18 LAB — LIPID PANEL W/O CHOL/HDL RATIO
Cholesterol, Total: 149 mg/dL (ref 100–199)
HDL: 60 mg/dL (ref >39)
LDL Chol Calc (NIH): 62 mg/dL (ref 0–99)
Triglycerides: 164 mg/dL — ABNORMAL HIGH (ref 0–149)
VLDL Cholesterol Cal: 27 mg/dL (ref 5–40)

## 2024-06-18 LAB — HEMOGLOBIN A1C
Est. average glucose Bld gHb Est-mCnc: 166 mg/dL
Hgb A1c MFr Bld: 7.4 % — ABNORMAL HIGH (ref 4.8–5.6)

## 2024-06-18 LAB — TSH: TSH: 0.446 u[IU]/mL — ABNORMAL LOW (ref 0.450–4.500)

## 2024-06-20 ENCOUNTER — Ambulatory Visit: Payer: Self-pay | Admitting: Internal Medicine

## 2024-06-20 DIAGNOSIS — E1165 Type 2 diabetes mellitus with hyperglycemia: Secondary | ICD-10-CM

## 2024-06-20 MED ORDER — DAPAGLIFLOZIN PROPANEDIOL 10 MG PO TABS: 10.0000 mg | ORAL_TABLET | Freq: Every day | ORAL | 6 refills | Status: DC

## 2024-06-22 NOTE — Progress Notes (Signed)
 Patient notified

## 2024-07-19 ENCOUNTER — Ambulatory Visit: Admitting: Internal Medicine

## 2024-09-19 ENCOUNTER — Encounter: Payer: Self-pay | Admitting: Internal Medicine

## 2024-09-19 ENCOUNTER — Ambulatory Visit: Payer: Self-pay | Admitting: Internal Medicine

## 2024-09-19 ENCOUNTER — Ambulatory Visit (INDEPENDENT_AMBULATORY_CARE_PROVIDER_SITE_OTHER): Admitting: Internal Medicine

## 2024-09-19 VITALS — BP 124/72 | HR 62 | Ht 68.0 in | Wt 165.4 lb

## 2024-09-19 DIAGNOSIS — I152 Hypertension secondary to endocrine disorders: Secondary | ICD-10-CM

## 2024-09-19 DIAGNOSIS — E1159 Type 2 diabetes mellitus with other circulatory complications: Secondary | ICD-10-CM

## 2024-09-19 DIAGNOSIS — E1169 Type 2 diabetes mellitus with other specified complication: Secondary | ICD-10-CM

## 2024-09-19 DIAGNOSIS — Z8639 Personal history of other endocrine, nutritional and metabolic disease: Secondary | ICD-10-CM

## 2024-09-19 DIAGNOSIS — E782 Mixed hyperlipidemia: Secondary | ICD-10-CM | POA: Diagnosis not present

## 2024-09-19 DIAGNOSIS — E1165 Type 2 diabetes mellitus with hyperglycemia: Secondary | ICD-10-CM | POA: Diagnosis not present

## 2024-09-19 DIAGNOSIS — Z122 Encounter for screening for malignant neoplasm of respiratory organs: Secondary | ICD-10-CM | POA: Insufficient documentation

## 2024-09-19 DIAGNOSIS — Z860100 Personal history of colon polyps, unspecified: Secondary | ICD-10-CM

## 2024-09-19 LAB — POC CREATINE & ALBUMIN,URINE
Albumin/Creatinine Ratio, Urine, POC: <30
Creatinine, POC: 300 mg/dL
Microalbumin Ur, POC: 30 mg/L

## 2024-09-19 LAB — POCT CBG (FASTING - GLUCOSE)-MANUAL ENTRY: Glucose Fasting, POC: 170 mg/dL — AB (ref 70–99)

## 2024-09-19 NOTE — Progress Notes (Signed)
 Established Patient Office Visit  Subjective:  Patient ID: Matthew Donaldson., male    DOB: [birth year]  Age: 73 y.o. MRN: [id number]  Chief Complaint  Patient presents with   Follow-up    3 month follow up    Patient is here today for follow up. He needs Labs collected and patient is fasting. He reports he is taking his medications as prescribed and trying to eat healthy diet but endorses not exercising as he should. He reports Farxiga  was too expensive with his insurance and he has not been taking it. Unsure of last eye exam- no diabetic retinopathy noted but has hx of cataracts that are not causing issues with vision as reported by patient at this time.  Patient reports smoking cessation in the last 10-12 years and is due for low dose chest CT screening. Will order.  Denies headache, chest pain, shortness of breath, abdominal pain at this time. Patient reports he already had flu shot for the season.    No other concerns at this time.   Past Medical History:  Diagnosis Date   Colonic polyp 2007   [facility] ?MD   Diabetes mellitus without complication (HCC)    ORAL MEDS   ETOH abuse    QUIT FEB 2001   GERD (gastroesophageal reflux disease)    INDIGESTION   Gout    Hx of cocaine abuse (HCC)    QUIT FEB 2001   Hyperlipidemia     Past Surgical History:  Procedure Laterality Date   COLONOSCOPY WITH PROPOFOL  N/A 06/04/2015   Procedure: COLONOSCOPY WITH PROPOFOL ;  Surgeon: Rogelia Copping, MD;  Location: [facility];  Service: Endoscopy;  Laterality: N/A;  ORAL MED DIABETIC   COLONOSCOPY WITH PROPOFOL  N/A 07/20/2018   Procedure: COLONOSCOPY WITH PROPOFOL ;  Surgeon: Therisa Bi, MD;  Location: [facility];  Service: Gastroenterology;  Laterality: N/A;   COLONOSCOPY WITH PROPOFOL  N/A 12/16/2023   Procedure: COLONOSCOPY WITH PROPOFOL ;  Surgeon: Therisa Bi, MD;  Location: [facility] ENDOSCOPY;  Service: Gastroenterology;  Laterality: N/A;   ESOPHAGOGASTRODUODENOSCOPY (EGD)  WITH PROPOFOL  N/A 06/04/2015   Procedure: ESOPHAGOGASTRODUODENOSCOPY (EGD) WITH PROPOFOL ;  Surgeon: Rogelia Copping, MD;  Location: [facility];  Service: Endoscopy;  Laterality: N/A;   POLYPECTOMY  06/04/2015   Procedure: POLYPECTOMY;  Surgeon: Rogelia Copping, MD;  Location: [facility];  Service: Endoscopy;;    Social History   Socioeconomic History   Marital status: Married    Spouse name: Not on file   Number of children: 0   Years of education: Not on file   Highest education level: Not on file  Occupational History   Not on file  Tobacco Use   Smoking status: Former    Current packs/day: 0.00    Average packs/day: 1 pack/day for 30.0 years (30.0 ttl pk-yrs)    Types: Cigarettes    Start date: 12/03/1983    Quit date: 12/02/2013    Years since quitting: 10.8   Smokeless tobacco: Never  Vaping Use   Vaping status: Never Used  Substance and Sexual Activity   Alcohol use: Not Currently    Alcohol/week: 0.0 standard drinks of alcohol   Drug use: Not Currently    Comment: Recovered cocaine/heroin/marijuana/acid   Sexual activity: Not on file  Other Topics Concern   Not on file  Social History Narrative   Lives with wife BANKER), [employer]in [employer] Occasions   Social Drivers of Health   Financial Resource Strain: Not on file  Food Insecurity: Not on  file  Transportation Needs: Not on file  Physical Activity: Not on file  Stress: Not on file  Social Connections: Not on file  Intimate Partner Violence: Not on file    Family History  Problem Relation Age of Onset   Hypertension Mother    Diabetes Mother    Asthma Father    Stroke Father    Diabetes Father    Cirrhosis Father        ?weekend ETOH?other etiology   Colon cancer Neg Hx     No Known Allergies  Outpatient Medications Prior to Visit  Medication Sig   Ascorbic Acid (VITAMIN C) 1000 MG tablet Take 1,000 mg by mouth daily. AM   aspirin 81 MG tablet Take 81 mg by mouth daily. AM   atorvastatin  (LIPITOR)  10 MG tablet Take 1 tablet (10 mg total) by mouth daily.   calcium  citrate-vitamin D (CITRACAL+D) 315-200 MG-UNIT per tablet Take 1 tablet by mouth daily. AM   lisinopril (PRINIVIL,ZESTRIL) 5 MG tablet Take 5 mg by mouth daily. AM   metFORMIN (GLUMETZA) 500 MG (MOD) 24 hr tablet Take 500 mg by mouth 2 (two) times daily. AM AND PM   sildenafil  (VIAGRA ) 100 MG tablet Take 1 tablet (100 mg total) by mouth daily as needed for erectile dysfunction.   dapagliflozin  propanediol (FARXIGA ) 10 MG TABS tablet Take 1 tablet (10 mg total) by mouth daily before breakfast. (Patient not taking: Reported on 09/19/2024)   No facility-administered medications prior to visit.    Review of Systems  Constitutional: Negative.  Negative for chills, fever and malaise/fatigue.  HENT: Negative.  Negative for congestion and sore throat.   Eyes: Negative.  Negative for blurred vision and pain.  Respiratory: Negative.  Negative for cough and shortness of breath.   Cardiovascular: Negative.  Negative for chest pain, palpitations and leg swelling.  Gastrointestinal: Negative.  Negative for abdominal pain, blood in stool, constipation, diarrhea, heartburn, melena, nausea and vomiting.  Genitourinary: Negative.  Negative for dysuria, flank pain, frequency and urgency.  Musculoskeletal: Negative.  Negative for joint pain and myalgias.  Skin: Negative.   Neurological: Negative.  Negative for dizziness, tingling, sensory change, weakness and headaches.  Endo/Heme/Allergies: Negative.   Psychiatric/Behavioral: Negative.  Negative for depression and suicidal ideas. The patient is not nervous/anxious.        Objective:   BP 124/72   Pulse 62   Ht 5' 8 (1.727 m)   Wt 165 lb 6.4 oz (75 kg)   SpO2 97%   BMI 25.15 kg/m   Vitals:   09/19/24 0959  BP: 124/72  Pulse: 62  Height: 5' 8 (1.727 m)  Weight: 165 lb 6.4 oz (75 kg)  SpO2: 97%  BMI (Calculated): 25.15    Physical Exam Vitals and nursing note reviewed.   Constitutional:      General: He is not in acute distress.    Appearance: Normal appearance. He is not ill-appearing.  HENT:     Head: Normocephalic and atraumatic.     Nose: Nose normal.     Mouth/Throat:     Mouth: Mucous membranes are moist.     Pharynx: Oropharynx is clear.  Eyes:     Conjunctiva/sclera: Conjunctivae normal.     Pupils: Pupils are equal, round, and reactive to light.  Cardiovascular:     Rate and Rhythm: Normal rate and regular rhythm.     Pulses: Normal pulses.     Heart sounds: Normal heart sounds.  Pulmonary:  Effort: Pulmonary effort is normal.     Breath sounds: Normal breath sounds. No wheezing or rhonchi.  Abdominal:     General: Bowel sounds are normal. There is no distension.     Palpations: Abdomen is soft.     Tenderness: There is no abdominal tenderness.  Musculoskeletal:        General: Normal range of motion.     Cervical back: Normal range of motion and neck supple.     Right lower leg: No edema.     Left lower leg: No edema.  Skin:    General: Skin is warm and dry.     Capillary Refill: Capillary refill takes less than 2 seconds.  Neurological:     General: No focal deficit present.     Mental Status: He is alert and oriented to person, place, and time.     Sensory: No sensory deficit.     Motor: No weakness.  Psychiatric:        Mood and Affect: Mood normal.        Behavior: Behavior normal.        Judgment: Judgment normal.      Results for orders placed or performed in visit on 09/19/24  POCT CBG (Fasting - Glucose)  Result Value Ref Range   Glucose Fasting, POC 170 (A) 70 - 99 mg/dL  POC CREATINE & ALBUMIN,URINE  Result Value Ref Range   Microalbumin Ur, POC 30 mg/L   Creatinine, POC 300 mg/dL   Albumin/Creatinine Ratio, Urine, POC <30     Recent Results (from the past 2160 hours)  POCT CBG (Fasting - Glucose)     Status: Abnormal   Collection Time: 09/19/24 10:04 AM  Result Value Ref Range   Glucose Fasting, POC  170 (A) 70 - 99 mg/dL  POC CREATINE & ALBUMIN,URINE     Status: Normal   Collection Time: 09/19/24 10:26 AM  Result Value Ref Range   Microalbumin Ur, POC 30 mg/L   Creatinine, POC 300 mg/dL   Albumin/Creatinine Ratio, Urine, POC <30       Assessment & Plan:  Continue taking medications as prescribed. Reinforced healthy diet and exercise as tolerated. Check labs today and FU with patient on results. Low dose chest CT lung cancer screening ordered. Problem List Items Addressed This Visit     History of colonic polyps   Relevant Orders   CBC with Diff   Hypertension associated with diabetes (HCC) - Primary   Relevant Orders   CMP14+EGFR   CBC with Diff   Type 2 diabetes mellitus with hyperglycemia, without long-term current use of insulin (HCC)   Relevant Orders   POCT CBG (Fasting - Glucose) (Completed)   Hemoglobin A1c   POC CREATINE & ALBUMIN,URINE (Completed)   Combined hyperlipidemia associated with type 2 diabetes mellitus (HCC)   Relevant Orders   Lipid Panel w/o Chol/HDL Ratio   History of thyroiditis   Relevant Orders   TSH   Encounter for screening for lung cancer   Relevant Orders   CT CHEST LUNG CANCER SCREENING LOW DOSE WO CONTRAST    Return in about 1 week (around 09/26/2024).   Total time spent: 25 minutes. This time includes review of previous notes and results and patient face to face interaction during today's visit.    Matthew FREDY RAMAN, MD  09/19/2024   This document may have been prepared by [facility] Voice Recognition software and as such may include unintentional dictation errors.

## 2024-09-20 LAB — LIPID PANEL W/O CHOL/HDL RATIO
Cholesterol, Total: 129 mg/dL (ref 100–199)
HDL: 58 mg/dL (ref >39)
LDL Chol Calc (NIH): 52 mg/dL (ref 0–99)
Triglycerides: 102 mg/dL (ref 0–149)
VLDL Cholesterol Cal: 19 mg/dL (ref 5–40)

## 2024-09-20 LAB — CBC WITH DIFFERENTIAL/PLATELET
Basophils Absolute: 0.1 x10E3/uL (ref 0.0–0.2)
Basos: 1 %
EOS (ABSOLUTE): 0.5 x10E3/uL — ABNORMAL HIGH (ref 0.0–0.4)
Eos: 5 %
Hematocrit: 43.2 % (ref 37.5–51.0)
Hemoglobin: 14.3 g/dL (ref 13.0–17.7)
Immature Grans (Abs): 0 x10E3/uL (ref 0.0–0.1)
Immature Granulocytes: 0 %
Lymphocytes Absolute: 2.6 x10E3/uL (ref 0.7–3.1)
Lymphs: 30 %
MCH: 29.1 pg (ref 26.6–33.0)
MCHC: 33.1 g/dL (ref 31.5–35.7)
MCV: 88 fL (ref 79–97)
Monocytes Absolute: 0.5 x10E3/uL (ref 0.1–0.9)
Monocytes: 6 %
Neutrophils Absolute: 5 x10E3/uL (ref 1.4–7.0)
Neutrophils: 58 %
Platelets: 255 x10E3/uL (ref 150–450)
RBC: 4.91 x10E6/uL (ref 4.14–5.80)
RDW: 12.6 % (ref 11.6–15.4)
WBC: 8.7 x10E3/uL (ref 3.4–10.8)

## 2024-09-20 LAB — CMP14+EGFR
ALT: 39 IU/L (ref 0–44)
AST: 25 IU/L (ref 0–40)
Albumin: 4.5 g/dL (ref 3.8–4.8)
Alkaline Phosphatase: 106 IU/L (ref 47–123)
BUN/Creatinine Ratio: 12 (ref 10–24)
BUN: 11 mg/dL (ref 8–27)
Bilirubin Total: 0.8 mg/dL (ref 0.0–1.2)
CO2: 25 mmol/L (ref 20–29)
Calcium: 10 mg/dL (ref 8.6–10.2)
Chloride: 104 mmol/L (ref 96–106)
Creatinine, Ser: 0.92 mg/dL (ref 0.76–1.27)
Globulin, Total: 2.3 g/dL (ref 1.5–4.5)
Glucose: 154 mg/dL — ABNORMAL HIGH (ref 70–99)
Potassium: 4.8 mmol/L (ref 3.5–5.2)
Sodium: 142 mmol/L (ref 134–144)
Total Protein: 6.8 g/dL (ref 6.0–8.5)
eGFR: 87 mL/min/1.73 (ref >59)

## 2024-09-20 LAB — HEMOGLOBIN A1C
Est. average glucose Bld gHb Est-mCnc: 174 mg/dL
Hgb A1c MFr Bld: 7.7 % — ABNORMAL HIGH (ref 4.8–5.6)

## 2024-09-20 LAB — TSH: TSH: 0.524 u[IU]/mL (ref 0.450–4.500)

## 2024-09-20 MED ORDER — GLIMEPIRIDE 2 MG PO TABS: 2.0000 mg | ORAL_TABLET | Freq: Every day | ORAL | 3 refills | Status: AC

## 2024-09-20 NOTE — Progress Notes (Signed)
 Patient notified

## 2024-09-26 ENCOUNTER — Ambulatory Visit (INDEPENDENT_AMBULATORY_CARE_PROVIDER_SITE_OTHER): Admitting: Internal Medicine

## 2024-09-26 ENCOUNTER — Ambulatory Visit: Payer: Self-pay | Admitting: Internal Medicine

## 2024-09-26 ENCOUNTER — Encounter: Payer: Self-pay | Admitting: Internal Medicine

## 2024-09-26 VITALS — BP 126/60 | HR 64 | Ht 68.0 in | Wt 162.6 lb

## 2024-09-26 DIAGNOSIS — E1169 Type 2 diabetes mellitus with other specified complication: Secondary | ICD-10-CM | POA: Diagnosis not present

## 2024-09-26 DIAGNOSIS — E1159 Type 2 diabetes mellitus with other circulatory complications: Secondary | ICD-10-CM

## 2024-09-26 DIAGNOSIS — I152 Hypertension secondary to endocrine disorders: Secondary | ICD-10-CM | POA: Diagnosis not present

## 2024-09-26 DIAGNOSIS — E782 Mixed hyperlipidemia: Secondary | ICD-10-CM | POA: Diagnosis not present

## 2024-09-26 DIAGNOSIS — E1165 Type 2 diabetes mellitus with hyperglycemia: Secondary | ICD-10-CM | POA: Diagnosis not present

## 2024-09-26 LAB — POCT CBG (FASTING - GLUCOSE)-MANUAL ENTRY: Glucose Fasting, POC: 117 mg/dL — AB (ref 70–99)

## 2024-09-26 NOTE — Progress Notes (Signed)
 Established Patient Office Visit  Subjective:  Patient ID: Matthew Dean., male    DOB: 03/23/1951  Age: 73 y.o. MRN: [id number]  Chief Complaint  Patient presents with   Follow-up    1 week follow up    Patient comes in for follow up today. His hgba1c has jumped to 7.7 from 7.4. Farxiga  was not covered so Glimepride 2 mg po every day was sent. Patient is taking it regularly, and tolerating it well. Average fingerstick glucose in 122. He is also on Metformin 500 mg bid , but reports of loose stool on it. Will reduce Metformin to once a day but increase Glimepride to 2 mg bid.Advised to monitor for hypoglycemic episodes.     No other concerns at this time.   Past Medical History:  Diagnosis Date   Colonic polyp 2007   [facility] ?MD   Diabetes mellitus without complication (HCC)    ORAL MEDS   ETOH abuse    QUIT FEB 2001   GERD (gastroesophageal reflux disease)    INDIGESTION   Gout    Hx of cocaine abuse (HCC)    QUIT FEB 2001   Hyperlipidemia     Past Surgical History:  Procedure Laterality Date   COLONOSCOPY WITH PROPOFOL  N/A 06/04/2015   Procedure: COLONOSCOPY WITH PROPOFOL ;  Surgeon: Rogelia Copping, MD;  Location: [facility];  Service: Endoscopy;  Laterality: N/A;  ORAL MED DIABETIC   COLONOSCOPY WITH PROPOFOL  N/A 07/20/2018   Procedure: COLONOSCOPY WITH PROPOFOL ;  Surgeon: Therisa Bi, MD;  Location: [facility] ENDOSCOPY;  Service: Gastroenterology;  Laterality: N/A;   COLONOSCOPY WITH PROPOFOL  N/A 12/16/2023   Procedure: COLONOSCOPY WITH PROPOFOL ;  Surgeon: Therisa Bi, MD;  Location: [facility] ENDOSCOPY;  Service: Gastroenterology;  Laterality: N/A;   ESOPHAGOGASTRODUODENOSCOPY (EGD) WITH PROPOFOL  N/A 06/04/2015   Procedure: ESOPHAGOGASTRODUODENOSCOPY (EGD) WITH PROPOFOL ;  Surgeon: Rogelia Copping, MD;  Location: [facility];  Service: Endoscopy;  Laterality: N/A;   POLYPECTOMY  06/04/2015   Procedure: POLYPECTOMY;  Surgeon: Rogelia Copping, MD;  Location:  [facility];  Service: Endoscopy;;    Social History   Socioeconomic History   Marital status: Married    Spouse name: Not on file   Number of children: 0   Years of education: Not on file   Highest education level: Not on file  Occupational History   Not on file  Tobacco Use   Smoking status: Former    Current packs/day: 0.00    Average packs/day: 1 pack/day for 30.0 years (30.0 ttl pk-yrs)    Types: Cigarettes    Start date: 12/03/1983    Quit date: 12/02/2013    Years since quitting: 10.8   Smokeless tobacco: Never  Vaping Use   Vaping status: Never Used  Substance and Sexual Activity   Alcohol use: Not Currently    Alcohol/week: 0.0 standard drinks of alcohol   Drug use: Not Currently    Comment: Recovered cocaine/heroin/marijuana/acid   Sexual activity: Not on file  Other Topics Concern   Not on file  Social History Narrative   Lives with wife (CHARITY FUNDRAISER), [employer] Occasions   Social Drivers of Health   Financial Resource Strain: Not on file  Food Insecurity: Not on file  Transportation Needs: Not on file  Physical Activity: Not on file  Stress: Not on file  Social Connections: Not on file  Intimate Partner Violence: Not on file    Family History  Problem Relation Age of Onset   Hypertension Mother  Diabetes Mother    Asthma Father    Stroke Father    Diabetes Father    Cirrhosis Father        ?weekend ETOH?other etiology   Colon cancer Neg Hx     No Known Allergies  Outpatient Medications Prior to Visit  Medication Sig   Ascorbic Acid (VITAMIN C) 1000 MG tablet Take 1,000 mg by mouth daily. AM   aspirin 81 MG tablet Take 81 mg by mouth daily. AM   atorvastatin  (LIPITOR) 10 MG tablet Take 1 tablet (10 mg total) by mouth daily.   calcium  citrate-vitamin D (CITRACAL+D) 315-200 MG-UNIT per tablet Take 1 tablet by mouth daily. AM   glimepiride (AMARYL) 2 MG tablet Take 1 tablet (2 mg total) by mouth daily with breakfast.   lisinopril  (PRINIVIL,ZESTRIL) 5 MG tablet Take 5 mg by mouth daily. AM   metFORMIN (GLUMETZA) 500 MG (MOD) 24 hr tablet Take 500 mg by mouth 2 (two) times daily. AM AND PM   sildenafil  (VIAGRA ) 100 MG tablet Take 1 tablet (100 mg total) by mouth daily as needed for erectile dysfunction.   dapagliflozin  propanediol (FARXIGA ) 10 MG TABS tablet Take 1 tablet (10 mg total) by mouth daily before breakfast. (Patient not taking: Reported on 09/26/2024)   No facility-administered medications prior to visit.    Review of Systems  Constitutional: Negative.  Negative for chills, fever and malaise/fatigue.  HENT: Negative.  Negative for congestion and sore throat.   Eyes: Negative.  Negative for blurred vision and pain.  Respiratory: Negative.  Negative for cough and shortness of breath.   Cardiovascular: Negative.  Negative for chest pain, palpitations and leg swelling.  Gastrointestinal: Negative.  Negative for abdominal pain, blood in stool, constipation, diarrhea, heartburn, melena, nausea and vomiting.  Genitourinary: Negative.  Negative for dysuria, flank pain, frequency and urgency.  Musculoskeletal: Negative.  Negative for joint pain and myalgias.  Skin: Negative.   Neurological: Negative.  Negative for dizziness, tingling, sensory change, weakness and headaches.  Endo/Heme/Allergies: Negative.   Psychiatric/Behavioral: Negative.  Negative for depression and suicidal ideas. The patient is not nervous/anxious.        Objective:   BP 126/60   Pulse 64   Ht 5' 8 (1.727 m)   Wt 162 lb 9.6 oz (73.8 kg)   SpO2 97%   BMI 24.72 kg/m   Vitals:   09/26/24 1145  BP: 126/60  Pulse: 64  Height: 5' 8 (1.727 m)  Weight: 162 lb 9.6 oz (73.8 kg)  SpO2: 97%  BMI (Calculated): 24.73    Physical Exam Vitals and nursing note reviewed.  Constitutional:      General: He is not in acute distress.    Appearance: Normal appearance. He is not ill-appearing.  HENT:     Head: Normocephalic and atraumatic.      Nose: Nose normal.     Mouth/Throat:     Mouth: Mucous membranes are moist.     Pharynx: Oropharynx is clear.  Eyes:     Conjunctiva/sclera: Conjunctivae normal.     Pupils: Pupils are equal, round, and reactive to light.  Cardiovascular:     Rate and Rhythm: Normal rate and regular rhythm.     Pulses: Normal pulses.     Heart sounds: Normal heart sounds.  Pulmonary:     Effort: Pulmonary effort is normal.     Breath sounds: Normal breath sounds. No wheezing or rhonchi.  Abdominal:     General: Bowel sounds are normal. There  is no distension.     Palpations: Abdomen is soft.     Tenderness: There is no abdominal tenderness.  Musculoskeletal:        General: Normal range of motion.     Cervical back: Normal range of motion and neck supple.     Right lower leg: No edema.     Left lower leg: No edema.  Skin:    General: Skin is warm and dry.     Capillary Refill: Capillary refill takes less than 2 seconds.  Neurological:     General: No focal deficit present.     Mental Status: He is alert and oriented to person, place, and time.     Sensory: No sensory deficit.     Motor: No weakness.  Psychiatric:        Mood and Affect: Mood normal.        Behavior: Behavior normal.        Judgment: Judgment normal.      Results for orders placed or performed in visit on 09/26/24  POCT CBG (Fasting - Glucose)  Result Value Ref Range   Glucose Fasting, POC 117 (A) 70 - 99 mg/dL    Recent Results (from the past 2160 hours)  POCT CBG (Fasting - Glucose)     Status: Abnormal   Collection Time: 09/19/24 10:04 AM  Result Value Ref Range   Glucose Fasting, POC 170 (A) 70 - 99 mg/dL  POC CREATINE & ALBUMIN,URINE     Status: Normal   Collection Time: 09/19/24 10:26 AM  Result Value Ref Range   Microalbumin Ur, POC 30 mg/L   Creatinine, POC 300 mg/dL   Albumin/Creatinine Ratio, Urine, POC <30   CMP14+EGFR     Status: Abnormal   Collection Time: 09/19/24 10:43 AM  Result Value Ref  Range   Glucose 154 (H) 70 - 99 mg/dL   BUN 11 8 - 27 mg/dL   Creatinine, Ser 9.07 0.76 - 1.27 mg/dL   eGFR 87 >40 fO/fpw/8.26   BUN/Creatinine Ratio 12 10 - 24   Sodium 142 134 - 144 mmol/L   Potassium 4.8 3.5 - 5.2 mmol/L   Chloride 104 96 - 106 mmol/L   CO2 25 20 - 29 mmol/L   Calcium  10.0 8.6 - 10.2 mg/dL   Total Protein 6.8 6.0 - 8.5 g/dL   Albumin 4.5 3.8 - 4.8 g/dL   Globulin, Total 2.3 1.5 - 4.5 g/dL   Bilirubin Total 0.8 0.0 - 1.2 mg/dL   Alkaline Phosphatase 106 47 - 123 IU/L   AST 25 0 - 40 IU/L   ALT 39 0 - 44 IU/L  Lipid Panel w/o Chol/HDL Ratio     Status: None   Collection Time: 09/19/24 10:43 AM  Result Value Ref Range   Cholesterol, Total 129 100 - 199 mg/dL   Triglycerides 897 0 - 149 mg/dL   HDL 58 >60 mg/dL   VLDL Cholesterol Cal 19 5 - 40 mg/dL   LDL Chol Calc (NIH) 52 0 - 99 mg/dL  CBC with Diff     Status: Abnormal   Collection Time: 09/19/24 10:43 AM  Result Value Ref Range   WBC 8.7 3.4 - 10.8 x10E3/uL   RBC 4.91 4.14 - 5.80 x10E6/uL   Hemoglobin 14.3 13.0 - 17.7 g/dL   Hematocrit 56.7 62.4 - 51.0 %   MCV 88 79 - 97 fL   MCH 29.1 26.6 - 33.0 pg   MCHC 33.1 31.5 - 35.7 g/dL  RDW 12.6 11.6 - 15.4 %   Platelets 255 150 - 450 x10E3/uL   Neutrophils 58 Not Estab. %   Lymphs 30 Not Estab. %   Monocytes 6 Not Estab. %   Eos 5 Not Estab. %   Basos 1 Not Estab. %   Neutrophils Absolute 5.0 1.4 - 7.0 x10E3/uL   Lymphocytes Absolute 2.6 0.7 - 3.1 x10E3/uL   Monocytes Absolute 0.5 0.1 - 0.9 x10E3/uL   EOS (ABSOLUTE) 0.5 (H) 0.0 - 0.4 x10E3/uL   Basophils Absolute 0.1 0.0 - 0.2 x10E3/uL   Immature Granulocytes 0 Not Estab. %   Immature Grans (Abs) 0.0 0.0 - 0.1 x10E3/uL  Hemoglobin A1c     Status: Abnormal   Collection Time: 09/19/24 10:43 AM  Result Value Ref Range   Hgb A1c MFr Bld 7.7 (H) 4.8 - 5.6 %    Comment:          Prediabetes: 5.7 - 6.4          Diabetes: >6.4          Glycemic control for adults with diabetes: <7.0    Est. average  glucose Bld gHb Est-mCnc 174 mg/dL  TSH     Status: None   Collection Time: 09/19/24 10:43 AM  Result Value Ref Range   TSH 0.524 0.450 - 4.500 uIU/mL  POCT CBG (Fasting - Glucose)     Status: Abnormal   Collection Time: 09/26/24 11:52 AM  Result Value Ref Range   Glucose Fasting, POC 117 (A) 70 - 99 mg/dL      Assessment & Plan:  Meds adjusted. Continue monitoring fingerstick glucose. Problem List Items Addressed This Visit     Hypertension associated with diabetes (HCC)   Type 2 diabetes mellitus with hyperglycemia, without long-term current use of insulin (HCC) - Primary   Relevant Orders   POCT CBG (Fasting - Glucose) (Completed)   Combined hyperlipidemia associated with type 2 diabetes mellitus (HCC)    Return in about 1 month (around 10/26/2024).   Total time spent: 30 minutes. This time includes review of previous notes and results and patient face to face interaction during today's visit.    FERNAND FREDY RAMAN, MD  09/26/2024   This document may have been prepared by [facility] Voice Recognition software and as such may include unintentional dictation errors.

## 2024-10-12 DIAGNOSIS — H43813 Vitreous degeneration, bilateral: Secondary | ICD-10-CM | POA: Diagnosis not present

## 2024-10-12 DIAGNOSIS — H2513 Age-related nuclear cataract, bilateral: Secondary | ICD-10-CM | POA: Diagnosis not present

## 2024-10-12 DIAGNOSIS — E119 Type 2 diabetes mellitus without complications: Secondary | ICD-10-CM | POA: Diagnosis not present

## 2024-10-12 DIAGNOSIS — H40003 Preglaucoma, unspecified, bilateral: Secondary | ICD-10-CM | POA: Diagnosis not present

## 2024-10-18 ENCOUNTER — Encounter: Payer: Self-pay | Admitting: Internal Medicine

## 2024-10-31 ENCOUNTER — Ambulatory Visit
Admission: RE | Admit: 2024-10-31 | Discharge: 2024-10-31 | Disposition: A | Source: Home / Self Care | Attending: Internal Medicine | Admitting: Internal Medicine

## 2024-10-31 ENCOUNTER — Emergency Department

## 2024-10-31 ENCOUNTER — Encounter: Payer: Self-pay | Admitting: Internal Medicine

## 2024-10-31 ENCOUNTER — Ambulatory Visit (INDEPENDENT_AMBULATORY_CARE_PROVIDER_SITE_OTHER): Admitting: Internal Medicine

## 2024-10-31 ENCOUNTER — Emergency Department
Admission: EM | Admit: 2024-10-31 | Discharge: 2024-10-31 | Disposition: A | Discharge status: Home / Self Care | Attending: Emergency Medicine | Admitting: Emergency Medicine

## 2024-10-31 ENCOUNTER — Ambulatory Visit
Admission: RE | Admit: 2024-10-31 | Discharge: 2024-10-31 | Disposition: A | Source: Ambulatory Visit | Attending: Internal Medicine | Admitting: Internal Medicine

## 2024-10-31 ENCOUNTER — Ambulatory Visit: Payer: Self-pay | Admitting: Internal Medicine

## 2024-10-31 ENCOUNTER — Other Ambulatory Visit: Payer: Self-pay

## 2024-10-31 VITALS — BP 130/68 | HR 80 | Ht 68.0 in | Wt 168.4 lb

## 2024-10-31 DIAGNOSIS — K760 Fatty (change of) liver, not elsewhere classified: Secondary | ICD-10-CM

## 2024-10-31 DIAGNOSIS — R140 Abdominal distension (gaseous): Secondary | ICD-10-CM | POA: Insufficient documentation

## 2024-10-31 DIAGNOSIS — K5900 Constipation, unspecified: Secondary | ICD-10-CM

## 2024-10-31 DIAGNOSIS — E1159 Type 2 diabetes mellitus with other circulatory complications: Secondary | ICD-10-CM

## 2024-10-31 DIAGNOSIS — E782 Mixed hyperlipidemia: Secondary | ICD-10-CM

## 2024-10-31 DIAGNOSIS — K5790 Diverticulosis of intestine, part unspecified, without perforation or abscess without bleeding: Secondary | ICD-10-CM

## 2024-10-31 DIAGNOSIS — I152 Hypertension secondary to endocrine disorders: Secondary | ICD-10-CM

## 2024-10-31 DIAGNOSIS — E1169 Type 2 diabetes mellitus with other specified complication: Secondary | ICD-10-CM | POA: Diagnosis not present

## 2024-10-31 DIAGNOSIS — K5730 Diverticulosis of large intestine without perforation or abscess without bleeding: Secondary | ICD-10-CM | POA: Insufficient documentation

## 2024-10-31 DIAGNOSIS — I878 Other specified disorders of veins: Secondary | ICD-10-CM | POA: Diagnosis not present

## 2024-10-31 DIAGNOSIS — E1165 Type 2 diabetes mellitus with hyperglycemia: Secondary | ICD-10-CM | POA: Diagnosis not present

## 2024-10-31 DIAGNOSIS — R110 Nausea: Secondary | ICD-10-CM | POA: Diagnosis not present

## 2024-10-31 LAB — COMPREHENSIVE METABOLIC PANEL WITH GFR
ALT: 28 U/L (ref 0–44)
AST: 26 U/L (ref 15–41)
Albumin: 4.5 g/dL (ref 3.5–5.0)
Alkaline Phosphatase: 103 U/L (ref 38–126)
Anion gap: 11 (ref 5–15)
BUN: 15 mg/dL (ref 8–23)
CO2: 22 mmol/L (ref 22–32)
Calcium: 10 mg/dL (ref 8.9–10.3)
Chloride: 107 mmol/L (ref 98–111)
Creatinine, Ser: 0.93 mg/dL (ref 0.61–1.24)
GFR, Estimated: >60 mL/min (ref >60)
Glucose, Bld: 113 mg/dL — ABNORMAL HIGH (ref 70–99)
Potassium: 4.5 mmol/L (ref 3.5–5.1)
Sodium: 140 mmol/L (ref 135–145)
Total Bilirubin: 0.9 mg/dL (ref 0.0–1.2)
Total Protein: 7.3 g/dL (ref 6.5–8.1)

## 2024-10-31 LAB — CBC
HCT: 42.4 % (ref 39.0–52.0)
Hemoglobin: 14.3 g/dL (ref 13.0–17.0)
MCH: 29.1 pg (ref 26.0–34.0)
MCHC: 33.7 g/dL (ref 30.0–36.0)
MCV: 86.4 fL (ref 80.0–100.0)
Platelets: 263 K/uL (ref 150–400)
RBC: 4.91 MIL/uL (ref 4.22–5.81)
RDW: 12.9 % (ref 11.5–15.5)
WBC: 9.7 K/uL (ref 4.0–10.5)
nRBC: 0 % (ref 0.0–0.2)

## 2024-10-31 LAB — URINALYSIS, ROUTINE W REFLEX MICROSCOPIC
Bilirubin Urine: NEGATIVE
Glucose, UA: NEGATIVE mg/dL
Hgb urine dipstick: NEGATIVE
Ketones, ur: NEGATIVE mg/dL
Leukocytes,Ua: NEGATIVE
Nitrite: NEGATIVE
Protein, ur: NEGATIVE mg/dL
Specific Gravity, Urine: 1.019 (ref 1.005–1.030)
pH: 5 (ref 5.0–8.0)

## 2024-10-31 LAB — LIPASE, BLOOD: Lipase: 26 U/L (ref 11–51)

## 2024-10-31 LAB — POCT CBG (FASTING - GLUCOSE)-MANUAL ENTRY: Glucose Fasting, POC: 195 mg/dL — AB (ref 70–99)

## 2024-10-31 MED ORDER — IOHEXOL 300 MG/ML  SOLN
100.0000 mL | Freq: Once | INTRAMUSCULAR | Status: AC | PRN
  Administered 2024-10-31: 100 mL via INTRAVENOUS

## 2024-10-31 MED ORDER — METRONIDAZOLE 500 MG PO TABS: 500.0000 mg | ORAL_TABLET | Freq: Two times a day (BID) | ORAL | 0 refills | Status: DC

## 2024-10-31 MED ORDER — ONDANSETRON HCL 4 MG PO TABS: 4.0000 mg | ORAL_TABLET | Freq: Three times a day (TID) | ORAL | 0 refills | Status: AC | PRN

## 2024-10-31 MED ORDER — IOHEXOL 9 MG/ML PO SOLN
500.0000 mL | ORAL | Status: AC
  Administered 2024-10-31 (×2): 500 mL via ORAL
  Filled 2024-10-31 (×2): qty 500

## 2024-10-31 MED ORDER — DOCUSATE SODIUM 100 MG PO CAPS: 100.0000 mg | ORAL_CAPSULE | Freq: Every day | ORAL | 0 refills | Status: AC | PRN

## 2024-10-31 MED ORDER — CIPROFLOXACIN HCL 500 MG PO TABS: 500.0000 mg | ORAL_TABLET | Freq: Two times a day (BID) | ORAL | 0 refills | Status: DC

## 2024-10-31 MED ORDER — POLYETHYLENE GLYCOL 3350 17 G PO PACK: 17.0000 g | PACK | Freq: Every day | ORAL | 0 refills | Status: AC

## 2024-10-31 MED ORDER — ATORVASTATIN CALCIUM 10 MG PO TABS: 10.0000 mg | ORAL_TABLET | Freq: Every day | ORAL | 3 refills | Status: AC

## 2024-10-31 NOTE — ED Notes (Signed)
 Patient ambulatory to restroom independently

## 2024-10-31 NOTE — ED Triage Notes (Signed)
 Pt to ED via POV from PCP. Pt reports intermittent generalized abd pain and bloating. Pt denies V/D. Last BM this morning. PCP concerned for SBO. No hx

## 2024-10-31 NOTE — Discharge Instructions (Addendum)
 You were seen in the emergency department today for constipation and diverticulosis, which are small pouches that form in the colon but are not dangerous unless you are having severe abdominal pain.  You also have fatty liver disease. Please read through the information packets to learn more about your diagnoses.  We recommend that you use one or more of the following over-the-counter medications in the order described for your constipation:   1)  Miralax  (powder):  This medication works by drawing additional fluid into your intestines and helps to flush out your stool.  Mix the powder with water  or juice according to label instructions.  Be sure to use the recommended amount of water  or juice when you mix up the powder.  Plenty of fluids will help to prevent constipation. 2)  Colace (or Dulcolax) 100 mg:  This is a stool softener, and you may take it once or twice a day as needed. 3)  Senna tablets:  This is a bowel stimulant that will help push out your stool. It is the next step to add after you have tried a stool softener. 4)  Magnesium citrate: This is typically sold in a clear glass bottle also purchased without the need for prescription.  You can drink the bottle and it typically stimulates your bowels within a short period of time.  You may also want to consider using glycerin suppositories, which you insert into your rectum.  You hold it in place and is dissolves and softens your stool and stimulates your bowels.  You could also consider using an enema, which is also available over the counter.   I will send in the Miralax  and Colace for you to the pharmacy.  Drink plenty of fluids and walk regularly.  Please return to the Emergency Department immediately if you develop new or worsening symptoms that concern you, such as (but not limited to) fever > 101 degrees, severe abdominal pain, or persistent vomiting.  Follow up with your primary care provider following today's visit.

## 2024-10-31 NOTE — Progress Notes (Signed)
 Established Patient Office Visit  Subjective:  Patient ID: Matthew Sayres., male    DOB: 1951-02-22  Age: 72 y.o. MRN: [id number]  Chief Complaint  Patient presents with   Follow-up    1 month follow up    Patient is here today for follow up. He reports feeling fairly well until yesterday.  He reports generalized abdominal pain since last night after eating veggie stir fry that contained mini corn. He denies mucous or blood in his stools. He denies any changes to his bowel habits. He reports last BM was this morning. He has diverticulosis that were found on his last colonoscopy. Will consider empiric treatment for diverticulitis with Cipro  and Flagyl , but he has abdominal distension with reduced bowel sounds . Check abdominal xray to r/o bowel obstruction. Start Zofran  as needed for nausea.  Add: Abdominal xray showed suspected bowel obstruction and gasey distention, with stool burden. Recommended CT. Advised patient to go to the emergency room for CT and possible bowel disimpaction.  He reports fasting blood sugars on average 95-120. He reports trying to eat a healthy diet and exercise as tolerated. He reports this morning blood sugar was 196 and has not taken any medications yet. He endorses feeling more tired than normal the last week.     No other concerns at this time.   Past Medical History:  Diagnosis Date   Colonic polyp 2007   [facility] ?MD   Diabetes mellitus without complication (HCC)    ORAL MEDS   ETOH abuse    QUIT FEB 2001   GERD (gastroesophageal reflux disease)    INDIGESTION   Gout    Hx of cocaine abuse (HCC)    QUIT FEB 2001   Hyperlipidemia     Past Surgical History:  Procedure Laterality Date   COLONOSCOPY WITH PROPOFOL  N/A 06/04/2015   Procedure: COLONOSCOPY WITH PROPOFOL ;  Surgeon: Rogelia Copping, MD;  Location: [facility];  Service: Endoscopy;  Laterality: N/A;  ORAL MED DIABETIC   COLONOSCOPY WITH PROPOFOL  N/A 07/20/2018    Procedure: COLONOSCOPY WITH PROPOFOL ;  Surgeon: Therisa Bi, MD;  Location: [facility] END[facility]COPY;  Service: Gastroenterology;  Laterality: N/A;   COLONOSCOPY WITH PROPOFOL  N/A 12/16/2023   Procedure: COLONOSCOPY WITH PROPOFOL ;  Surgeon: Therisa Bi, MD;  Location: [facility] END[facility]COPY;  Service: Gastroenterology;  Laterality: N/A;   ESOPHAGOGASTRODUODENOSCOPY (EGD) WITH PROPOFOL  N/A 06/04/2015   Procedure: ESOPHAGOGASTRODUODENOSCOPY (EGD) WITH PROPOFOL ;  Surgeon: Rogelia Copping, MD;  Location: [facility];  Service: Endoscopy;  Laterality: N/A;   POLYPECTOMY  06/04/2015   Procedure: POLYPECTOMY;  Surgeon: Rogelia Copping, MD;  Location: [facility];  Service: Endoscopy;;    Social History   Socioeconomic History   Marital status: Married    Spouse name: Not on file   Number of children: 0   Years of education: Not on file   Highest education level: Not on file  Occupational History   Not on file  Tobacco Use   Smoking status: Former    Current packs/day: 0.00    Average packs/day: 1 pack/day for 30.0 years (30.0 ttl pk-yrs)    Types: Cigarettes    Start date: 12/03/1983    Quit date: 12/02/2013    Years since quitting: 10.9   Smokeless tobacco: Never  Vaping Use   Vaping status: Never Used  Substance and Sexual Activity   Alcohol use: Not Currently    Alcohol/week: 0.0 standard drinks of alcohol   Drug use: Not Currently    Comment:  Recovered cocaine/heroin/marijuana/acid   Sexual activity: Not on file  Other Topics Concern   Not on file  Social History Narrative   Lives with wife (RN), [employer] Occasions   Social Drivers of Health   Financial Resource Strain: Not on file  Food Insecurity: Not on file  Transportation Needs: Not on file  Physical Activity: Not on file  Stress: Not on file  Social Connections: Not on file  Intimate Partner Violence: Not on file    Family History  Problem Relation Age of Onset   Hypertension Mother    Diabetes Mother    Asthma Father     Stroke Father    Diabetes Father    Cirrhosis Father        ?weekend ETOH?other etiology   Colon cancer Neg Hx     No Known Allergies  Outpatient Medications Prior to Visit  Medication Sig   Ascorbic Acid (VITAMIN C) 1000 MG tablet Take 1,000 mg by mouth daily. AM   aspirin 81 MG tablet Take 81 mg by mouth daily. AM   calcium  citrate-vitamin D (CITRACAL+D) 315-200 MG-UNIT per tablet Take 1 tablet by mouth daily. AM   glimepiride  (AMARYL ) 2 MG tablet Take 1 tablet (2 mg total) by mouth daily with breakfast.   lisinopril (PRINIVIL,ZESTRIL) 5 MG tablet Take 5 mg by mouth daily. AM   metFORMIN (GLUMETZA) 500 MG (MOD) 24 hr tablet Take 500 mg by mouth 2 (two) times daily. AM AND PM   sildenafil  (VIAGRA ) 100 MG tablet Take 1 tablet (100 mg total) by mouth daily as needed for erectile dysfunction.   [DISCONTINUED] atorvastatin  (LIPITOR) 10 MG tablet Take 1 tablet (10 mg total) by mouth daily.   [DISCONTINUED] dapagliflozin  propanediol (FARXIGA ) 10 MG TABS tablet Take 1 tablet (10 mg total) by mouth daily before breakfast. (Patient not taking: Reported on 10/31/2024)   No facility-administered medications prior to visit.    Review of Systems  Constitutional:  Positive for malaise/fatigue. Negative for chills and fever.  HENT: Negative.  Negative for congestion and sore throat.   Eyes: Negative.  Negative for blurred vision and pain.  Respiratory: Negative.  Negative for cough and shortness of breath.   Cardiovascular: Negative.  Negative for chest pain, palpitations and leg swelling.  Gastrointestinal:  Positive for abdominal pain and nausea. Negative for blood in stool, constipation, diarrhea, heartburn, melena and vomiting.  Genitourinary: Negative.  Negative for dysuria, flank pain, frequency and urgency.  Musculoskeletal: Negative.  Negative for joint pain and myalgias.  Skin: Negative.   Neurological: Negative.  Negative for dizziness, tingling, sensory change, weakness and headaches.   Endo/Heme/Allergies: Negative.   Psychiatric/Behavioral: Negative.  Negative for depression and suicidal ideas. The patient is not nervous/anxious.        Objective:   BP 130/68   Pulse 80   Ht 5' 8 (1.727 m)   Wt 168 lb 6.4 oz (76.4 kg)   SpO2 96%   BMI 25.61 kg/m   Vitals:   10/31/24 1104  BP: 130/68  Pulse: 80  Height: 5' 8 (1.727 m)  Weight: 168 lb 6.4 oz (76.4 kg)  SpO2: 96%  BMI (Calculated): 25.61    Physical Exam Vitals and nursing note reviewed.  Constitutional:      General: He is not in acute distress.    Appearance: Normal appearance. He is not ill-appearing.  HENT:     Head: Normocephalic and atraumatic.     Nose: Nose normal.     Mouth/Throat:  Mouth: Mucous membranes are moist.     Pharynx: Oropharynx is clear.  Eyes:     Conjunctiva/sclera: Conjunctivae normal.     Pupils: Pupils are equal, round, and reactive to light.  Cardiovascular:     Rate and Rhythm: Normal rate and regular rhythm.     Pulses: Normal pulses.     Heart sounds: Normal heart sounds.  Pulmonary:     Effort: Pulmonary effort is normal.     Breath sounds: Normal breath sounds. No wheezing or rhonchi.  Abdominal:     General: Bowel sounds are decreased. There is distension.     Palpations: Abdomen is soft.     Tenderness: There is generalized abdominal tenderness. There is no guarding or rebound.     Comments: Decreased bowel sounds LLQ  Musculoskeletal:        General: Normal range of motion.     Cervical back: Normal range of motion and neck supple.     Right lower leg: No edema.     Left lower leg: No edema.  Skin:    General: Skin is warm and dry.     Capillary Refill: Capillary refill takes less than 2 seconds.  Neurological:     General: No focal deficit present.     Mental Status: He is alert and oriented to person, place, and time.     Sensory: No sensory deficit.     Motor: No weakness.  Psychiatric:        Mood and Affect: Mood normal.         Behavior: Behavior normal.        Judgment: Judgment normal.      Results for orders placed or performed in visit on 10/31/24  POCT CBG (Fasting - Glucose)  Result Value Ref Range   Glucose Fasting, POC 195 (A) 70 - 99 mg/dL    Recent Results (from the past 2160 hours)  POCT CBG (Fasting - Glucose)     Status: Abnormal   Collection Time: 09/19/24 10:04 AM  Result Value Ref Range   Glucose Fasting, POC 170 (A) 70 - 99 mg/dL  POC CREATINE & ALBUMIN,URINE     Status: Normal   Collection Time: 09/19/24 10:26 AM  Result Value Ref Range   Microalbumin Ur, POC 30 mg/L   Creatinine, POC 300 mg/dL   Albumin/Creatinine Ratio, Urine, POC <30   CMP14+EGFR     Status: Abnormal   Collection Time: 09/19/24 10:43 AM  Result Value Ref Range   Glucose 154 (H) 70 - 99 mg/dL   BUN 11 8 - 27 mg/dL   Creatinine, Ser 9.07 0.76 - 1.27 mg/dL   eGFR 87 >40 fO/fpw/8.26   BUN/Creatinine Ratio 12 10 - 24   Sodium 142 134 - 144 mmol/L   Potassium 4.8 3.5 - 5.2 mmol/L   Chloride 104 96 - 106 mmol/L   CO2 25 20 - 29 mmol/L   Calcium  10.0 8.6 - 10.2 mg/dL   Total Protein 6.8 6.0 - 8.5 g/dL   Albumin 4.5 3.8 - 4.8 g/dL   Globulin, Total 2.3 1.5 - 4.5 g/dL   Bilirubin Total 0.8 0.0 - 1.2 mg/dL   Alkaline Phosphatase 106 47 - 123 IU/L   AST 25 0 - 40 IU/L   ALT 39 0 - 44 IU/L  Lipid Panel w/o Chol/HDL Ratio     Status: None   Collection Time: 09/19/24 10:43 AM  Result Value Ref Range   Cholesterol, Total 129 100 - 199  mg/dL   Triglycerides 897 0 - 149 mg/dL   HDL 58 >60 mg/dL   VLDL Cholesterol Cal 19 5 - 40 mg/dL   LDL Chol Calc (NIH) 52 0 - 99 mg/dL  CBC with Diff     Status: Abnormal   Collection Time: 09/19/24 10:43 AM  Result Value Ref Range   WBC 8.7 3.4 - 10.8 x10E3/uL   RBC 4.91 4.14 - 5.80 x10E6/uL   Hemoglobin 14.3 13.0 - 17.7 g/dL   Hematocrit 56.7 62.4 - 51.0 %   MCV 88 79 - 97 fL   MCH 29.1 26.6 - 33.0 pg   MCHC 33.1 31.5 - 35.7 g/dL   RDW 87.3 88.3 - 84.5 %   Platelets 255 150  - 450 x10E3/uL   Neutrophils 58 Not Estab. %   Lymphs 30 Not Estab. %   Monocytes 6 Not Estab. %   Eos 5 Not Estab. %   Basos 1 Not Estab. %   Neutrophils Absolute 5.0 1.4 - 7.0 x10E3/uL   Lymphocytes Absolute 2.6 0.7 - 3.1 x10E3/uL   Monocytes Absolute 0.5 0.1 - 0.9 x10E3/uL   EOS (ABSOLUTE) 0.5 (H) 0.0 - 0.4 x10E3/uL   Basophils Absolute 0.1 0.0 - 0.2 x10E3/uL   Immature Granulocytes 0 Not Estab. %   Immature Grans (Abs) 0.0 0.0 - 0.1 x10E3/uL  Hemoglobin A1c     Status: Abnormal   Collection Time: 09/19/24 10:43 AM  Result Value Ref Range   Hgb A1c MFr Bld 7.7 (H) 4.8 - 5.6 %    Comment:          Prediabetes: 5.7 - 6.4          Diabetes: >6.4          Glycemic control for adults with diabetes: <7.0    Est. average glucose Bld gHb Est-mCnc 174 mg/dL  TSH     Status: None   Collection Time: 09/19/24 10:43 AM  Result Value Ref Range   TSH 0.524 0.450 - 4.500 uIU/mL  POCT CBG (Fasting - Glucose)     Status: Abnormal   Collection Time: 09/26/24 11:52 AM  Result Value Ref Range   Glucose Fasting, POC 117 (A) 70 - 99 mg/dL  POCT CBG (Fasting - Glucose)     Status: Abnormal   Collection Time: 10/31/24 11:08 AM  Result Value Ref Range   Glucose Fasting, POC 195 (A) 70 - 99 mg/dL      Assessment & Plan:  Refill sent. Continue medications as prescribed. Start Cipro  and Flagyl  twice daily for 7 days. Start PRN Zofran . Abdominal xray ordered. Educated patient on foods to avoid for diverticulosis. Reinforced healthy diet and exercise as tolerated. Problem List Items Addressed This Visit     Hypertension associated with diabetes (HCC)   Relevant Medications   atorvastatin  (LIPITOR) 10 MG tablet   Type 2 diabetes mellitus with hyperglycemia, without long-term current use of insulin (HCC)   Relevant Medications   atorvastatin  (LIPITOR) 10 MG tablet   Other Relevant Orders   POCT CBG (Fasting - Glucose) (Completed)   Combined hyperlipidemia associated with type 2 diabetes mellitus  (HCC) - Primary   Relevant Medications   atorvastatin  (LIPITOR) 10 MG tablet   Abdominal distention   Relevant Medications   ciprofloxacin  (CIPRO ) 500 MG tablet   metroNIDAZOLE  (FLAGYL ) 500 MG tablet   ondansetron  (ZOFRAN ) 4 MG tablet   Other Relevant Orders   DG Abd 1 View (Completed)   Diverticulosis of colon   Relevant  Medications   ciprofloxacin  (CIPRO ) 500 MG tablet   metroNIDAZOLE  (FLAGYL ) 500 MG tablet   Other Relevant Orders   DG Abd 1 View (Completed)    Return in about 4 days (around 11/04/2024).   Total time spent: 25 minutes. This time includes review of previous notes and results and patient face to face interaction during today's visit.    FERNAND FREDY RAMAN, MD  10/31/2024   This document may have been prepared by [facility] Voice Recognition software and as such may include unintentional dictation errors.

## 2024-10-31 NOTE — ED Notes (Signed)
 CT notified of need for Oral Omnipaque .

## 2024-10-31 NOTE — ED Provider Notes (Signed)
 [facility] Provider Note    Event Date/Time   First MD Initiated Contact with Patient 10/31/24 1646     (approximate)   History   Abdominal Pain   HPI  Matthew Dean. is a 73 y.o. male  with a past medical history of HTN, diabetes, colonic polyp, Hx cocaine abuse, ETOH abuse, GERD, HLD, diverticulosis presents to the emergency department from his primary care provider's office with concern for SBO.  Patient reports he had generalized abdominal pain and nausea that started last night.  He denies any vomiting, diarrhea, chest pain, shortness of breath, dyspnea, increased urinary frequency, dysuria, back pain.  No recent sickness. Patient's last bowel movement was this morning.  He does not report any current abdominal pain at the time of my evaluation, but came to the ER due to concern by his PCP.  I reviewed the medical chart.  Patient was seen by PCP today with concern for abdominal distention, reduced bowel sounds and had abdominal x-ray completed with concern for suspected bowel obstruction, gas distention and stool burden seen with recommended CT by radiology.    Physical Exam   Triage Vital Signs: ED Triage Vitals  Encounter Vitals Group     BP 10/31/24 1447 136/67     Girls Systolic BP Percentile --      Girls Diastolic BP Percentile --      Boys Systolic BP Percentile --      Boys Diastolic BP Percentile --      Pulse Rate 10/31/24 1447 69     Resp 10/31/24 1447 18     Temp 10/31/24 1447 97.9 F (36.6 C)     Temp Source 10/31/24 1447 Oral     SpO2 10/31/24 1447 98 %     Weight 10/31/24 1446 165 lb (74.8 kg)     Height 10/31/24 1446 5' 8 (1.727 m)     Head Circumference --      Peak Flow --      Pain Score 10/31/24 1452 0     Pain Loc --      Pain Education --      Exclude from Growth Chart --     Most recent vital signs: Vitals:   10/31/24 1447 10/31/24 1941  BP: 136/67 122/71  Pulse: 69 65  Resp: 18 17  Temp: 97.9 F (36.6  C) 98.2 F (36.8 C)  SpO2: 98% 100%    General: Awake, in no acute distress. Appears stated age. Head: Normocephalic, atraumatic. Neck: Supple, no lymphadenopathy, no nuchal rigidity. CV: Good peripheral perfusion. RRR 69 bpm. Respiratory:Normal respiratory effort.  No respiratory distress. CTAB. GI: Soft, non-distended, non-tender. No rebound or guarding. Negative Murphy's sign. Bowel sounds present in all 4 quadrants. Skin:Warm, dry, intact. No rashes, lesions, or ecchymosis. No cyanosis or pallor. Neurological: A&Ox4 to person, place, time, and situation.   ED Results / Procedures / Treatments   Labs (all labs ordered are listed, but only abnormal results are displayed) Labs Reviewed  COMPREHENSIVE METABOLIC PANEL WITH GFR - Abnormal; Notable for the following components:      Result Value   Glucose, Bld 113 (*)    All other components within normal limits  URINALYSIS, ROUTINE W REFLEX MICROSCOPIC - Abnormal; Notable for the following components:   Color, Urine YELLOW (*)    APPearance CLEAR (*)    All other components within normal limits  LIPASE, BLOOD  CBC     EKG     RADIOLOGY  CT abdomen pelvis ordered.   PROCEDURES:  Critical Care performed: No   Procedures   MEDICATIONS ORDERED IN ED: Medications  iohexol  (OMNIPAQUE ) 300 MG/ML solution 100 mL (100 mLs Intravenous Contrast Given 10/31/24 2005)  iohexol  (OMNIPAQUE ) 9 MG/ML oral solution 500 mL (500 mLs Oral Contrast Given 10/31/24 1800)     IMPRESSION / MDM / ASSESSMENT AND PLAN / ED COURSE  I reviewed the triage vital signs and the nursing notes.                              Differential diagnosis includes, but is not limited to, constipation, SBO, diverticulosis, diverticulitis, UTI  Patient's presentation is most consistent with acute complicated illness / injury requiring diagnostic workup.   Clinical Course as of 10/31/24 2100  Mon Oct 31, 2024  1712 Patient presenting from the ED for  abdominal pain and nausea with concern for SBO from abdominal x-ray obtained by PCP.  Patient is well-appearing, afebrile, no abdominal pain on exam. He had a recent BM this morning. CBC unremarkable w/ normal WBC count of 9.7. CMP, Lipase unremarkable. UA needs collection.  On my examination, his abdomen does not look distended, but given PCP's concern and abdominal x-ray findings concerning for SBO with radiologist's suggestion for CT, will obtain CT abdomen pelvis with contrast. [SD]  1832 ABD X Ray result from PCP's office  IMPRESSION: Moderate stool burden. Mild gaseous distension of small bowel in the left mid abdomen may be related. Difficult to exclude an early or partial small bowel obstruction. If further evaluation is desired, CT abdomen pelvis with contrast is suggested.    [SD]  2034 CT abd pelvis IMPRESSION: 1. No bowel obstruction or ileus. 2. Trace free fluid within the left lower quadrant, nonspecific. 3. Distal colonic diverticulosis without CT evidence for diverticulitis. 4. Hepatic steatosis. 5.  Aortic Atherosclerosis (ICD10-I70.0).  [SD]  2057 Patient is well-appearing on my re-evaluation. No evidence of SBO on CT. CT does show diverticulosis and hepatic steatosis. Small trace free fluid noted in LLQ that is nonspecific. Patient has normal WBC count, afebrile, nontoxic and in no pain at this time. He is hungry and feels ready to go home. He was provided information on diverticulosis and hepatic steatosis. Will treat stool burden seen on outpatient KUB w/ Miralax  and Colace prescriptions.  He can follow-up with his primary care provider following today's visit. [SD]    Clinical Course User Index [SD] Sheron Salm, PA-C   The patient may return to the emergency department for any new, worsening, or concerning symptoms. Patient was given the opportunity to ask questions; all questions were answered. Emergency department return precautions were discussed with the patient.   Patient is in agreement to the treatment plan.  Patient is stable for discharge.   FINAL CLINICAL IMPRESSION(S) / ED DIAGNOSES   Final diagnoses:  Diverticulosis  Constipation, unspecified constipation type  Hepatic steatosis     Rx / DC Orders   ED Discharge Orders          Ordered    polyethylene glycol (MIRALAX ) 17 g packet  Daily        10/31/24 2052    docusate sodium  (COLACE) 100 MG capsule  Daily PRN        10/31/24 2052             Note:  This document was prepared using Dragon voice recognition software and may include unintentional dictation errors.  Sheron Salm, PA-C 10/31/24 2101    Jacolyn Pae, MD 10/31/24 2209

## 2024-10-31 NOTE — ED Notes (Signed)
 Patient transported to CT

## 2024-10-31 NOTE — ED Notes (Signed)
 EDP, Siadecki at bedside; update provided to patient at this time.

## 2024-10-31 NOTE — Progress Notes (Signed)
 Patient notified

## 2024-10-31 NOTE — ED Notes (Signed)
 CT made aware that patient had completed both Omnipaque  solution bottles.  Per CT, patient was not dosed until 1730, therefore scan will be timed for 1930.  Patient made aware.

## 2024-11-04 ENCOUNTER — Ambulatory Visit: Payer: Self-pay | Admitting: Internal Medicine

## 2024-11-04 ENCOUNTER — Ambulatory Visit: Admitting: Internal Medicine

## 2024-11-04 ENCOUNTER — Encounter: Payer: Self-pay | Admitting: Internal Medicine

## 2024-11-04 VITALS — BP 130/60 | HR 70 | Ht 68.0 in | Wt 168.2 lb

## 2024-11-04 DIAGNOSIS — I152 Hypertension secondary to endocrine disorders: Secondary | ICD-10-CM | POA: Diagnosis not present

## 2024-11-04 DIAGNOSIS — Z860100 Personal history of colon polyps, unspecified: Secondary | ICD-10-CM | POA: Diagnosis not present

## 2024-11-04 DIAGNOSIS — E1165 Type 2 diabetes mellitus with hyperglycemia: Secondary | ICD-10-CM

## 2024-11-04 DIAGNOSIS — E1159 Type 2 diabetes mellitus with other circulatory complications: Secondary | ICD-10-CM

## 2024-11-04 DIAGNOSIS — K5901 Slow transit constipation: Secondary | ICD-10-CM | POA: Diagnosis not present

## 2024-11-04 DIAGNOSIS — Z0001 Encounter for general adult medical examination with abnormal findings: Secondary | ICD-10-CM | POA: Diagnosis not present

## 2024-11-04 DIAGNOSIS — E782 Mixed hyperlipidemia: Secondary | ICD-10-CM

## 2024-11-04 DIAGNOSIS — R140 Abdominal distension (gaseous): Secondary | ICD-10-CM | POA: Diagnosis not present

## 2024-11-04 DIAGNOSIS — E1169 Type 2 diabetes mellitus with other specified complication: Secondary | ICD-10-CM | POA: Diagnosis not present

## 2024-11-04 LAB — POCT CBG (FASTING - GLUCOSE)-MANUAL ENTRY: Glucose Fasting, POC: 156 mg/dL — AB (ref 70–99)

## 2024-11-04 NOTE — Progress Notes (Signed)
 Established Patient Office Visit  Subjective:  Patient ID: Matthew Dean., male    DOB: 28-Feb-1951  Age: 73 y.o. MRN: [id number]  Chief Complaint  Patient presents with   Follow-up    4 day follow up    Patient comes in for his ED visit follow-up.  Patient was initially seen in the office on 10/31/2024 with complaints of abdominal pain, nausea and vomiting.  Patient had a history of diverticulosis, but he did not have any blood per rectum.  He had reported he was passing loose stools though.  However his abdomen was distended with reduced bowel sounds, x-ray of the abdomen showed stool burden with the possibility of SBO.  Patient was then sent to the emergency room for further evaluation.  In the ED his CT showed no obstruction or diverticulitis, but constipation.  He was discharged with instructions to use MiraLAX  and Colace. Today patient returns and he is feeling much better.  He is now having regular bowel movements daily with no nausea, no vomiting and no abdominal pain or distention.  His blood pressure and blood sugars are still within normal limits.    No other concerns at this time.   Past Medical History:  Diagnosis Date   Colonic polyp 2007   [facility] ?MD   Diabetes mellitus without complication (HCC)    ORAL MEDS   ETOH abuse    QUIT FEB 2001   GERD (gastroesophageal reflux disease)    INDIGESTION   Gout    Hx of cocaine abuse (HCC)    QUIT FEB 2001   Hyperlipidemia     Past Surgical History:  Procedure Laterality Date   COLONOSCOPY WITH PROPOFOL  N/A 06/04/2015   Procedure: COLONOSCOPY WITH PROPOFOL ;  Surgeon: Rogelia Copping, MD;  Location: [facility];  Service: Endoscopy;  Laterality: N/A;  ORAL MED DIABETIC   COLONOSCOPY WITH PROPOFOL  N/A 07/20/2018   Procedure: COLONOSCOPY WITH PROPOFOL ;  Surgeon: Therisa Bi, MD;  Location: [facility] END[facility];  Service: Gastroenterology;  Laterality: N/A;   COLONOSCOPY WITH PROPOFOL  N/A 12/16/2023   Procedure:  COLONOSCOPY WITH PROPOFOL ;  Surgeon: Therisa Bi, MD;  Location: [facility] ENDOSCOPY;  Service: Gastroenterology;  Laterality: N/A;   ESOPHAGOGASTRODUODENOSCOPY (EGD) WITH PROPOFOL  N/A 06/04/2015   Procedure: ESOPHAGOGASTRODUODENOSCOPY (EGD) WITH PROPOFOL ;  Surgeon: Rogelia Copping, MD;  Location: [facility];  Service: Endoscopy;  Laterality: N/A;   POLYPECTOMY  06/04/2015   Procedure: POLYPECTOMY;  Surgeon: Rogelia Copping, MD;  Location: [facility];  Service: Endoscopy;;    Social History   Socioeconomic History   Marital status: Married    Spouse name: Not on file   Number of children: 0   Years of education: Not on file   Highest education level: Not on file  Occupational History   Not on file  Tobacco Use   Smoking status: Former    Current packs/day: 0.00    Average packs/day: 1 pack/day for 30.0 years (30.0 ttl pk-yrs)    Types: Cigarettes    Start date: 12/03/1983    Quit date: 12/02/2013    Years since quitting: 10.9   Smokeless tobacco: Never  Vaping Use   Vaping status: Never Used  Substance and Sexual Activity   Alcohol use: Not Currently    Alcohol/week: 0.0 standard drinks of alcohol   Drug use: Not Currently    Comment: Recovered cocaine/heroin/marijuana/acid   Sexual activity: Not on file  Other Topics Concern   Not on file  Social History Narrative  Lives with wife BANKER), [employer]in [employer] Occasions   Social Drivers of Health   Tobacco Use: Medium Risk (11/04/2024)   Patient History    Smoking Tobacco Use: Former    Smokeless Tobacco Use: Never    Passive Exposure: Not on Actuary Strain: Not on file  Food Insecurity: Not on file  Transportation Needs: Not on file  Physical Activity: Not on file  Stress: Not on file  Social Connections: Not on file  Intimate Partner Violence: Not on file  Depression (PHQ2-9): Low Risk (06/17/2024)   Depression (PHQ2-9)    PHQ-2 Score: 0  Alcohol Screen: Not on file  Housing: Unknown (01/20/2024)    Received from [facility]   Epic    Unable to Pay for Housing in the Last Year: Not on file    Number of Times Moved in the Last Year: Not on file    At any time in the past 12 months, were you homeless or living in a shelter (including now)?: No  Utilities: Not on file  Health Literacy: Not on file    Family History  Problem Relation Age of Onset   Hypertension Mother    Diabetes Mother    Asthma Father    Stroke Father    Diabetes Father    Cirrhosis Father        ?weekend ETOH?other etiology   Colon cancer Neg Hx     Allergies[1]  Show/hide medication list[2]  Review of Systems  Constitutional: Negative.  Negative for chills, fever and malaise/fatigue.  HENT: Negative.  Negative for congestion and sore throat.   Eyes: Negative.  Negative for blurred vision and pain.  Respiratory: Negative.  Negative for cough and shortness of breath.   Cardiovascular: Negative.  Negative for chest pain, palpitations and leg swelling.  Gastrointestinal: Negative.  Negative for abdominal pain, blood in stool, constipation, diarrhea, heartburn, melena, nausea and vomiting.  Genitourinary: Negative.  Negative for dysuria, flank pain, frequency and urgency.  Musculoskeletal: Negative.  Negative for joint pain and myalgias.  Skin: Negative.   Neurological: Negative.  Negative for dizziness, tingling, sensory change, weakness and headaches.  Endo/Heme/Allergies: Negative.   Psychiatric/Behavioral: Negative.  Negative for depression and suicidal ideas. The patient is not nervous/anxious.        Objective:   BP 130/60   Pulse 70   Ht 5' 8 (1.727 m)   Wt 168 lb 3.2 oz (76.3 kg)   SpO2 96%   BMI 25.57 kg/m   Vitals:   11/04/24 1442  BP: 130/60  Pulse: 70  Height: 5' 8 (1.727 m)  Weight: 168 lb 3.2 oz (76.3 kg)  SpO2: 96%  BMI (Calculated): 25.58    Physical Exam Vitals and nursing note reviewed.  Constitutional:      General: He is not in acute distress.     Appearance: Normal appearance. He is not ill-appearing.  HENT:     Head: Normocephalic and atraumatic.     Nose: Nose normal.     Mouth/Throat:     Mouth: Mucous membranes are moist.     Pharynx: Oropharynx is clear.  Eyes:     Conjunctiva/sclera: Conjunctivae normal.     Pupils: Pupils are equal, round, and reactive to light.  Cardiovascular:     Rate and Rhythm: Normal rate and regular rhythm.     Pulses: Normal pulses.     Heart sounds: Normal heart sounds.  Pulmonary:     Effort: Pulmonary effort is  normal.     Breath sounds: Normal breath sounds. No wheezing or rhonchi.  Abdominal:     General: Bowel sounds are normal. There is no distension.     Palpations: Abdomen is soft.     Tenderness: There is no abdominal tenderness.  Musculoskeletal:        General: Normal range of motion.     Cervical back: Normal range of motion and neck supple.     Right lower leg: No edema.     Left lower leg: No edema.  Skin:    General: Skin is warm and dry.     Capillary Refill: Capillary refill takes less than 2 seconds.  Neurological:     General: No focal deficit present.     Mental Status: He is alert and oriented to person, place, and time.     Sensory: No sensory deficit.     Motor: No weakness.  Psychiatric:        Mood and Affect: Mood normal.        Behavior: Behavior normal.        Judgment: Judgment normal.      Results for orders placed or performed in visit on 11/04/24  POCT CBG (Fasting - Glucose)  Result Value Ref Range   Glucose Fasting, POC 156 (A) 70 - 99 mg/dL    Recent Results (from the past 2160 hours)  POCT CBG (Fasting - Glucose)     Status: Abnormal   Collection Time: 09/19/24 10:04 AM  Result Value Ref Range   Glucose Fasting, POC 170 (A) 70 - 99 mg/dL  POC CREATINE & ALBUMIN,URINE     Status: Normal   Collection Time: 09/19/24 10:26 AM  Result Value Ref Range   Microalbumin Ur, POC 30 mg/L   Creatinine, POC 300 mg/dL   Albumin/Creatinine Ratio,  Urine, POC <30   CMP14+EGFR     Status: Abnormal   Collection Time: 09/19/24 10:43 AM  Result Value Ref Range   Glucose 154 (H) 70 - 99 mg/dL   BUN 11 8 - 27 mg/dL   Creatinine, Ser 9.07 0.76 - 1.27 mg/dL   eGFR 87 >40 fO/fpw/8.26   BUN/Creatinine Ratio 12 10 - 24   Sodium 142 134 - 144 mmol/L   Potassium 4.8 3.5 - 5.2 mmol/L   Chloride 104 96 - 106 mmol/L   CO2 25 20 - 29 mmol/L   Calcium  10.0 8.6 - 10.2 mg/dL   Total Protein 6.8 6.0 - 8.5 g/dL   Albumin 4.5 3.8 - 4.8 g/dL   Globulin, Total 2.3 1.5 - 4.5 g/dL   Bilirubin Total 0.8 0.0 - 1.2 mg/dL   Alkaline Phosphatase 106 47 - 123 IU/L   AST 25 0 - 40 IU/L   ALT 39 0 - 44 IU/L  Lipid Panel w/o Chol/HDL Ratio     Status: None   Collection Time: 09/19/24 10:43 AM  Result Value Ref Range   Cholesterol, Total 129 100 - 199 mg/dL   Triglycerides 897 0 - 149 mg/dL   HDL 58 >60 mg/dL   VLDL Cholesterol Cal 19 5 - 40 mg/dL   LDL Chol Calc (NIH) 52 0 - 99 mg/dL  CBC with Diff     Status: Abnormal   Collection Time: 09/19/24 10:43 AM  Result Value Ref Range   WBC 8.7 3.4 - 10.8 x10E3/uL   RBC 4.91 4.14 - 5.80 x10E6/uL   Hemoglobin 14.3 13.0 - 17.7 g/dL   Hematocrit 56.7 62.4 - 51.0 %  MCV 88 79 - 97 fL   MCH 29.1 26.6 - 33.0 pg   MCHC 33.1 31.5 - 35.7 g/dL   RDW 87.3 88.3 - 84.5 %   Platelets 255 150 - 450 x10E3/uL   Neutrophils 58 Not Estab. %   Lymphs 30 Not Estab. %   Monocytes 6 Not Estab. %   Eos 5 Not Estab. %   Basos 1 Not Estab. %   Neutrophils Absolute 5.0 1.4 - 7.0 x10E3/uL   Lymphocytes Absolute 2.6 0.7 - 3.1 x10E3/uL   Monocytes Absolute 0.5 0.1 - 0.9 x10E3/uL   EOS (ABSOLUTE) 0.5 (H) 0.0 - 0.4 x10E3/uL   Basophils Absolute 0.1 0.0 - 0.2 x10E3/uL   Immature Granulocytes 0 Not Estab. %   Immature Grans (Abs) 0.0 0.0 - 0.1 x10E3/uL  Hemoglobin A1c     Status: Abnormal   Collection Time: 09/19/24 10:43 AM  Result Value Ref Range   Hgb A1c MFr Bld 7.7 (H) 4.8 - 5.6 %    Comment:          Prediabetes: 5.7 -  6.4          Diabetes: >6.4          Glycemic control for adults with diabetes: <7.0    Est. average glucose Bld gHb Est-mCnc 174 mg/dL  TSH     Status: None   Collection Time: 09/19/24 10:43 AM  Result Value Ref Range   TSH 0.524 0.450 - 4.500 uIU/mL  POCT CBG (Fasting - Glucose)     Status: Abnormal   Collection Time: 09/26/24 11:52 AM  Result Value Ref Range   Glucose Fasting, POC 117 (A) 70 - 99 mg/dL  POCT CBG (Fasting - Glucose)     Status: Abnormal   Collection Time: 10/31/24 11:08 AM  Result Value Ref Range   Glucose Fasting, POC 195 (A) 70 - 99 mg/dL  Lipase, blood     Status: None   Collection Time: 10/31/24  2:54 PM  Result Value Ref Range   Lipase 26 11 - 51 U/L    Comment: Performed at [facility], [address] [place], [place] [address]  Comprehensive metabolic panel     Status: Abnormal   Collection Time: 10/31/24  2:54 PM  Result Value Ref Range   Sodium 140 135 - 145 mmol/L   Potassium 4.5 3.5 - 5.1 mmol/L   Chloride 107 98 - 111 mmol/L   CO2 22 22 - 32 mmol/L   Glucose, Bld 113 (H) 70 - 99 mg/dL    Comment: Glucose reference range applies only to samples taken after fasting for at least 8 hours.   BUN 15 8 - 23 mg/dL   Creatinine, Ser 9.06 0.61 - 1.24 mg/dL   Calcium  10.0 8.9 - 10.3 mg/dL   Total Protein 7.3 6.5 - 8.1 g/dL   Albumin 4.5 3.5 - 5.0 g/dL   AST 26 15 - 41 U/L   ALT 28 0 - 44 U/L   Alkaline Phosphatase 103 38 - 126 U/L   Total Bilirubin 0.9 0.0 - 1.2 mg/dL   GFR, Estimated >39 >39 mL/min    Comment: (NOTE) Calculated using the CKD-EPI Creatinine Equation (2021)    Anion gap 11 5 - 15    Comment: Performed at [facility], [address] [place], [place] [address]  CBC     Status: None   Collection Time: 10/31/24  2:54 PM  Result Value Ref Range   WBC 9.7 4.0 -  10.5 K/uL   RBC 4.91 4.22 - 5.81 MIL/uL   Hemoglobin 14.3 13.0 - 17.0 g/dL   HCT 57.5 60.9 - 47.9 %   MCV 86.4 80.0 - 100.0 fL   MCH 29.1 26.0 - 34.0  pg   MCHC 33.7 30.0 - 36.0 g/dL   RDW 87.0 88.4 - 84.4 %   Platelets 263 150 - 400 K/uL   nRBC 0.0 0.0 - 0.2 %    Comment: Performed at [facility], [address] [place], [place] [address]  Urinalysis, Routine w reflex microscopic -Urine, Clean Catch     Status: Abnormal   Collection Time: 10/31/24  6:07 PM  Result Value Ref Range   Color, Urine YELLOW (A) YELLOW   APPearance CLEAR (A) CLEAR   Specific Gravity, Urine 1.019 1.005 - 1.030   pH 5.0 5.0 - 8.0   Glucose, UA NEGATIVE NEGATIVE mg/dL   Hgb urine dipstick NEGATIVE NEGATIVE   Bilirubin Urine NEGATIVE NEGATIVE   Ketones, ur NEGATIVE NEGATIVE mg/dL   Protein, ur NEGATIVE NEGATIVE mg/dL   Nitrite NEGATIVE NEGATIVE   Leukocytes,Ua NEGATIVE NEGATIVE    Comment: Performed at [facility], [address] [place], [place] [address]  POCT CBG (Fasting - Glucose)     Status: Abnormal   Collection Time: 11/04/24  2:47 PM  Result Value Ref Range   Glucose Fasting, POC 156 (A) 70 - 99 mg/dL      Assessment & Plan:  Continue current medications.  Patient should avoid getting constipated and encouraged to take MiraLAX  as needed.  Will check his labs before his next visit. Scheduled for [facility]. Problem List Items Addressed This Visit       Cardiovascular and Mediastinum   Hypertension associated with diabetes (HCC)   Relevant Orders   CBC with Diff   CMP14+EGFR     Digestive   Slow transit constipation - Primary     Endocrine   Type 2 diabetes mellitus with hyperglycemia, without long-term current use of insulin (HCC)   Relevant Orders   POCT CBG (Fasting - Glucose) (Completed)   Hemoglobin A1c   Combined hyperlipidemia associated with type 2 diabetes mellitus (HCC)   Relevant Orders   Lipid Panel w/o Chol/HDL Ratio     Other   History of colonic polyps   Relevant Orders   CBC with Diff   Abdominal distention    Return in about 2 months (around 01/05/2025).   Total time spent: 30 minutes. This  time includes review of previous notes and results and patient face to face interaction during today's visit.    FERNAND FREDY RAMAN, MD  11/04/2024   This document may have been prepared by [facility] Voice Recognition software and as such may include unintentional dictation errors.     [1] No Known Allergies [2]  Outpatient Medications Prior to Visit  Medication Sig   Ascorbic Acid (VITAMIN C) 1000 MG tablet Take 1,000 mg by mouth daily. AM   aspirin 81 MG tablet Take 81 mg by mouth daily. AM   atorvastatin  (LIPITOR) 10 MG tablet Take 1 tablet (10 mg total) by mouth daily.   calcium  citrate-vitamin D (CITRACAL+D) 315-200 MG-UNIT per tablet Take 1 tablet by mouth daily. AM   docusate sodium  (COLACE) 100 MG capsule Take 1 capsule (100 mg total) by mouth daily as needed for moderate constipation or mild constipation.   glimepiride  (AMARYL ) 2 MG tablet Take 1 tablet (2 mg total) by mouth daily with breakfast.   lisinopril (  PRINIVIL,ZESTRIL) 5 MG tablet Take 5 mg by mouth daily. AM   metFORMIN (GLUMETZA) 500 MG (MOD) 24 hr tablet Take 500 mg by mouth 2 (two) times daily. AM AND PM   metroNIDAZOLE  (FLAGYL ) 500 MG tablet Take 500 mg by mouth 2 (two) times daily.   polyethylene glycol (MIRALAX ) 17 g packet Take 17 g by mouth daily.   sildenafil  (VIAGRA ) 100 MG tablet Take 1 tablet (100 mg total) by mouth daily as needed for erectile dysfunction.   ondansetron  (ZOFRAN ) 4 MG tablet Take 1 tablet (4 mg total) by mouth every 8 (eight) hours as needed for nausea or vomiting. (Patient not taking: Reported on 11/04/2024)   No facility-administered medications prior to visit.

## 2025-01-09 ENCOUNTER — Ambulatory Visit: Admitting: Internal Medicine
